# Patient Record
Sex: Male | Born: 1960 | Race: Black or African American | Hispanic: No | Marital: Single | Smoking: Current every day smoker
Health system: Southern US, Community
[De-identification: ages and names within clinical notes are randomized; demographics above are authoritative.]

## PROBLEM LIST (undated history)

## (undated) DIAGNOSIS — K259 Gastric ulcer, unspecified as acute or chronic, without hemorrhage or perforation: Secondary | ICD-10-CM

## (undated) DIAGNOSIS — J45909 Unspecified asthma, uncomplicated: Secondary | ICD-10-CM

## (undated) DIAGNOSIS — K859 Acute pancreatitis without necrosis or infection, unspecified: Secondary | ICD-10-CM

## (undated) DIAGNOSIS — J60 Coalworker's pneumoconiosis: Secondary | ICD-10-CM

## (undated) DIAGNOSIS — J449 Chronic obstructive pulmonary disease, unspecified: Secondary | ICD-10-CM

---

## 2004-08-25 ENCOUNTER — Emergency Department (HOSPITAL_COMMUNITY): Admission: EM | Admit: 2004-08-25 | Discharge: 2004-08-25 | Payer: Self-pay | Admitting: Emergency Medicine

## 2007-03-31 ENCOUNTER — Emergency Department (HOSPITAL_COMMUNITY): Admission: EM | Admit: 2007-03-31 | Discharge: 2007-03-31 | Payer: Self-pay | Admitting: Family Medicine

## 2008-07-24 ENCOUNTER — Emergency Department (HOSPITAL_COMMUNITY): Admission: EM | Admit: 2008-07-24 | Discharge: 2008-07-25 | Payer: Self-pay | Admitting: Emergency Medicine

## 2012-09-10 ENCOUNTER — Ambulatory Visit (HOSPITAL_COMMUNITY)
Admission: RE | Admit: 2012-09-10 | Discharge: 2012-09-10 | Disposition: A | Discharge status: Home / Self Care | Payer: Self-pay | Source: Ambulatory Visit | Attending: Internal Medicine | Admitting: Internal Medicine

## 2012-09-10 ENCOUNTER — Other Ambulatory Visit (HOSPITAL_COMMUNITY): Payer: Self-pay | Admitting: Internal Medicine

## 2012-09-10 DIAGNOSIS — R059 Cough, unspecified: Secondary | ICD-10-CM | POA: Insufficient documentation

## 2012-09-10 DIAGNOSIS — R062 Wheezing: Secondary | ICD-10-CM | POA: Insufficient documentation

## 2012-09-10 DIAGNOSIS — R52 Pain, unspecified: Secondary | ICD-10-CM

## 2012-09-10 DIAGNOSIS — R05 Cough: Secondary | ICD-10-CM | POA: Insufficient documentation

## 2012-09-10 DIAGNOSIS — R079 Chest pain, unspecified: Secondary | ICD-10-CM | POA: Insufficient documentation

## 2015-07-17 ENCOUNTER — Emergency Department (HOSPITAL_COMMUNITY)
Admission: EM | Admit: 2015-07-17 | Discharge: 2015-07-18 | Disposition: A | Discharge status: Home / Self Care | Payer: Medicaid Other | Attending: Emergency Medicine | Admitting: Emergency Medicine

## 2015-07-17 ENCOUNTER — Emergency Department (HOSPITAL_COMMUNITY): Payer: Medicaid Other

## 2015-07-17 ENCOUNTER — Encounter (HOSPITAL_COMMUNITY): Payer: Self-pay

## 2015-07-17 DIAGNOSIS — R062 Wheezing: Secondary | ICD-10-CM

## 2015-07-17 DIAGNOSIS — R1012 Left upper quadrant pain: Secondary | ICD-10-CM | POA: Insufficient documentation

## 2015-07-17 DIAGNOSIS — L299 Pruritus, unspecified: Secondary | ICD-10-CM | POA: Diagnosis not present

## 2015-07-17 DIAGNOSIS — R0602 Shortness of breath: Secondary | ICD-10-CM | POA: Diagnosis present

## 2015-07-17 DIAGNOSIS — R42 Dizziness and giddiness: Secondary | ICD-10-CM | POA: Diagnosis not present

## 2015-07-17 DIAGNOSIS — Z880 Allergy status to penicillin: Secondary | ICD-10-CM | POA: Insufficient documentation

## 2015-07-17 DIAGNOSIS — F17210 Nicotine dependence, cigarettes, uncomplicated: Secondary | ICD-10-CM | POA: Diagnosis not present

## 2015-07-17 DIAGNOSIS — R1013 Epigastric pain: Secondary | ICD-10-CM | POA: Insufficient documentation

## 2015-07-17 DIAGNOSIS — Z79899 Other long term (current) drug therapy: Secondary | ICD-10-CM | POA: Insufficient documentation

## 2015-07-17 DIAGNOSIS — J441 Chronic obstructive pulmonary disease with (acute) exacerbation: Secondary | ICD-10-CM | POA: Insufficient documentation

## 2015-07-17 HISTORY — DX: Unspecified asthma, uncomplicated: J45.909

## 2015-07-17 HISTORY — DX: Coalworker's pneumoconiosis: J60

## 2015-07-17 HISTORY — DX: Chronic obstructive pulmonary disease, unspecified: J44.9

## 2015-07-17 LAB — COMPREHENSIVE METABOLIC PANEL
ALBUMIN: 2.7 g/dL — AB (ref 3.5–5.0)
ALK PHOS: 83 U/L (ref 38–126)
ALT: 69 U/L — AB (ref 17–63)
AST: 114 U/L — AB (ref 15–41)
Anion gap: 11 (ref 5–15)
BUN: 8 mg/dL (ref 6–20)
CALCIUM: 9 mg/dL (ref 8.9–10.3)
CO2: 25 mmol/L (ref 22–32)
CREATININE: 0.89 mg/dL (ref 0.61–1.24)
Chloride: 100 mmol/L — ABNORMAL LOW (ref 101–111)
GFR calc Af Amer: >60 mL/min (ref >60)
GFR calc non Af Amer: >60 mL/min (ref >60)
GLUCOSE: 109 mg/dL — AB (ref 65–99)
Potassium: 4 mmol/L (ref 3.5–5.1)
SODIUM: 136 mmol/L (ref 135–145)
Total Bilirubin: 0.1 mg/dL — ABNORMAL LOW (ref 0.3–1.2)
Total Protein: 7.6 g/dL (ref 6.5–8.1)

## 2015-07-17 LAB — CBC WITH DIFFERENTIAL/PLATELET
BASOS ABS: 0 10*3/uL (ref 0.0–0.1)
BASOS PCT: 0 %
EOS ABS: 0.1 10*3/uL (ref 0.0–0.7)
Eosinophils Relative: 1 %
HCT: 36.1 % — ABNORMAL LOW (ref 39.0–52.0)
HEMOGLOBIN: 12.1 g/dL — AB (ref 13.0–17.0)
LYMPHS ABS: 1.2 10*3/uL (ref 0.7–4.0)
Lymphocytes Relative: 18 %
MCH: 30.1 pg (ref 26.0–34.0)
MCHC: 33.5 g/dL (ref 30.0–36.0)
MCV: 89.8 fL (ref 78.0–100.0)
Monocytes Absolute: 0.3 10*3/uL (ref 0.1–1.0)
Monocytes Relative: 4 %
NEUTROS PCT: 77 %
Neutro Abs: 5 10*3/uL (ref 1.7–7.7)
Platelets: 215 10*3/uL (ref 150–400)
RBC: 4.02 MIL/uL — AB (ref 4.22–5.81)
RDW: 16.4 % — ABNORMAL HIGH (ref 11.5–15.5)
WBC: 6.5 10*3/uL (ref 4.0–10.5)

## 2015-07-17 LAB — ETHANOL: Alcohol, Ethyl (B): 190 mg/dL — ABNORMAL HIGH (ref <5)

## 2015-07-17 LAB — LIPASE, BLOOD: Lipase: 63 U/L — ABNORMAL HIGH (ref 11–51)

## 2015-07-17 MED ORDER — IPRATROPIUM BROMIDE 0.02 % IN SOLN
1.0000 mg | Freq: Once | RESPIRATORY_TRACT | Status: AC
  Administered 2015-07-17: 1 mg via RESPIRATORY_TRACT
  Filled 2015-07-17: qty 5

## 2015-07-17 MED ORDER — METHYLPREDNISOLONE SODIUM SUCC 125 MG IJ SOLR
125.0000 mg | Freq: Once | INTRAMUSCULAR | Status: AC
  Administered 2015-07-17: 125 mg via INTRAVENOUS
  Filled 2015-07-17: qty 2

## 2015-07-17 MED ORDER — ALBUTEROL (5 MG/ML) CONTINUOUS INHALATION SOLN
15.0000 mg/h | INHALATION_SOLUTION | Freq: Once | RESPIRATORY_TRACT | Status: AC
  Administered 2015-07-17: 15 mg/h via RESPIRATORY_TRACT
  Filled 2015-07-17: qty 20

## 2015-07-17 MED ORDER — SODIUM CHLORIDE 0.9 % IV BOLUS (SEPSIS)
1000.0000 mL | Freq: Once | INTRAVENOUS | Status: AC
  Administered 2015-07-17: 1000 mL via INTRAVENOUS

## 2015-07-17 NOTE — ED Notes (Signed)
Pt from home by [facility] for generalized body pain and weakness. Pt had 2 breathing treatments with EMS. Pt states he is feeling much better and is talking in full sentences and sating at 93%

## 2015-07-17 NOTE — ED Provider Notes (Signed)
CSN: [id number]     Arrival date & time 07/17/15  2053 History   First MD Initiated Contact with Patient 07/17/15 2124     Chief Complaint  Patient presents with  . Shortness of Breath     (Consider location/radiation/quality/duration/timing/severity/associated sxs/prior Treatment) HPI   Patient is a 55 year old male with a history of COPD, asthma, Black lung, seasonal allergies, alcohol and tobacco abuse who presents the ED with shortness of breath and epigastric pain since 8:30 PM today. He states in the past week he's had episodes of epigastric pain every 4-5 hours which makes it difficult for him to breathe. He states his shortness of breath is secondary to the abdominal pain although he has had some wheezing. He states he takes a shot of alcohol and the stomach pain goes away. He also states food relieves his abdominal pain. Everyday drinker and tobacco smoker. Associated itchy watery eyes, runny nose, sinus congestion, nausea, right handedness. Patient denies vomiting, chest pain, numbness/tingling or weakness, headache, visual changes.  Past Medical History  Diagnosis Date  . COPD (chronic obstructive pulmonary disease) ([place])   . Black lung ([place])   . Asthma    No past surgical history on file. No family history on file. Social History  Substance Use Topics  . Smoking status: Current Every Day Smoker -- 0.25 packs/day    Types: Cigarettes  . Smokeless tobacco: Not on file  . Alcohol Use: Yes     Comment: 1 pt of liquor     Review of Systems  Constitutional: Negative for fever and chills.  HENT: Positive for congestion and rhinorrhea. Negative for ear pain, hearing loss and trouble swallowing.   Eyes: Positive for itching. Negative for pain and visual disturbance.  Respiratory: Positive for shortness of breath and wheezing. Negative for chest tightness.   Cardiovascular: Negative for chest pain and leg swelling.  Gastrointestinal: Positive for abdominal pain. Negative for  nausea, vomiting, diarrhea and blood in stool.  Genitourinary: Negative for dysuria and hematuria.  Musculoskeletal: Negative for joint swelling.  Skin: Negative for rash.  Neurological: Positive for light-headedness. Negative for syncope, weakness, numbness and headaches.  Psychiatric/Behavioral: Negative for confusion and agitation.      Allergies  Penicillins and Asa  Home Medications   Prior to Admission medications   Medication Sig Start Date End Date Taking? Authorizing Provider  COMBIVENT RESPIMAT 20-100 MCG/ACT AERS respimat Inhale 1 puff into the lungs every 6 (six) hours. 06/26/15  Yes Historical Provider, MD  [employer] D3 2000 units CAPS Take 2,000 Units by mouth daily. 07/06/15  Yes Historical Provider, MD  PROVENTIL HFA 108 (90 Base) MCG/ACT inhaler Inhale 2 puffs into the lungs every 6 (six) hours as needed. 06/26/15  Yes Historical Provider, MD  QVAR 80 MCG/ACT inhaler Inhale 2 puffs into the lungs 2 (two) times daily. 06/26/15  Yes Historical Provider, MD  ranitidine (ZANTAC) 150 MG tablet Take 150 mg by mouth 2 (two) times daily. 06/26/15  Yes Historical Provider, MD  cetirizine-pseudoephedrine (ZYRTEC-D) 5-120 MG tablet Take 1 tablet by mouth daily. 07/18/15   Kalman Drape, PA  omeprazole (PRILOSEC) 40 MG capsule Take 1 capsule (40 mg total) by mouth daily. Reported on 07/17/2015 07/18/15   Kalman Drape, PA  predniSONE (DELTASONE) 20 MG tablet Take 3 tablets (60 mg total) by mouth daily. 07/18/15   Keasia Dubose L Brittny Spangle, PA   BP 127/79 mmHg  Pulse 86  Temp(Src) 98.5 F (36.9 C) (Oral)  Resp 18  Ht 5'  7" (1.702 m)  Wt 58.968 kg  BMI 20.36 kg/m2  SpO2 92% Physical Exam  Constitutional: He appears well-developed and well-nourished.  Non-toxic appearance. He does not appear ill. No distress.  HENT:  Head: Normocephalic and atraumatic.  Eyes: Right conjunctiva is injected. Left conjunctiva is injected. No scleral icterus.  Neck: Normal range of motion.  Cardiovascular: Normal  rate, regular rhythm and normal heart sounds.  Exam reveals no gallop and no friction rub.   No murmur heard. Pulses:      Radial pulses are 2+ on the right side, and 2+ on the left side.       Dorsalis pedis pulses are 2+ on the right side, and 2+ on the left side.  Pulmonary/Chest: Effort normal. No accessory muscle usage. No respiratory distress.  Expiratory wheezes heard throughout all lung fields  Abdominal: Soft. Normal appearance and bowel sounds are normal. There is no rigidity and no guarding.  TTP of the epigastric and LUQ region  Musculoskeletal: Normal range of motion. He exhibits no edema.  Neurological: He is alert. Coordination normal.  Skin: Skin is warm and dry. No rash noted.  Psychiatric: He has a normal mood and affect. His behavior is normal.    ED Course  Procedures (including critical care time) Labs Review Labs Reviewed  CBC WITH DIFFERENTIAL/PLATELET - Abnormal; Notable for the following:    RBC 4.02 (*)    Hemoglobin 12.1 (*)    HCT 36.1 (*)    RDW 16.4 (*)    All other components within normal limits  COMPREHENSIVE METABOLIC PANEL - Abnormal; Notable for the following:    Chloride 100 (*)    Glucose, Bld 109 (*)    Albumin 2.7 (*)    AST 114 (*)    ALT 69 (*)    Total Bilirubin 0.1 (*)    All other components within normal limits  LIPASE, BLOOD - Abnormal; Notable for the following:    Lipase 63 (*)    All other components within normal limits  ETHANOL - Abnormal; Notable for the following:    Alcohol, Ethyl (B) 190 (*)    All other components within normal limits    Imaging Review Dg Chest 2 View  07/18/2015  CLINICAL DATA:  Cough, wheezing, shortness of breath for 1 week. EXAM: CHEST  2 VIEW COMPARISON:  Radiograph 09/10/2012 FINDINGS: Increased hyperinflation and bronchial thickening from prior. The cardiomediastinal contours are normal. The lungs are clear. Pulmonary vasculature is normal. No consolidation, pleural effusion, or pneumothorax.  Remote right rib fractures again seen. Remote injury to the right acromioclavicular joint. IMPRESSION: Increased bronchial thickening and hyperinflation, may reflect acute bronchitis, infectious or inflammatory. Electronically Signed   By: Jeb Levering M.D.   On: 07/18/2015 00:23   I have personally reviewed and evaluated these images and lab results as part of my medical decision-making.   EKG Interpretation   Date/Time:  Sunday Jul 17 2015 21:08:14 EDT Ventricular Rate:  70 PR Interval:  158 QRS Duration: 86 QT Interval:  369 QTC Calculation: 398 R Axis:   7 Text Interpretation:  Sinus or ectopic atrial rhythm Anterior infarct, old  Baseline wander in lead(s) V5 No significant change since last tracing  Confirmed by YAO  MD, DAVID ([id number]) on 07/17/2015 10:24:14 PM      MDM   Final diagnoses:  Wheezing  Shortness of breath  Epigastric pain    Patient's presentation and exam likely a mild COPD exacerbation. Patient with O2 saturations maintained >  90, no current signs of respiratory distress. Lung exam improved after nebulizer treatment. Solumedrol given in the ED and pt will bd dc with 5 day burst of prednisone. Pt states they are breathing at baseline. X-ray reviewed by me revealed bronchial thickening and hyperinflation likely 2/2 chronic COPD, asthma and smoking. Patient denies cough and is afebrile less likely pneumonia or acute infectious process. Pt has been instructed to continue using prescribed medications and to speak with PCP about today's exacerbation. Gave patient a prescription for Zyrtec.   Patient states his abdominal pain improved while in the ED. Gave patient a GI cocktail and a prescription for Prilosec. Instructed patient to follow up with his PCP regarding his abdominal pain which is likely a gastric ulcer from alcohol consumption. I instructed the patient to discontinue drinking alcohol. Informed patient his lipase and liver enzymes slightly elevated likely  secondary to alcohol abuse. Lab finding positive for ethanol. Patient denies blood in stool or melena so this is less likely a bleeding ulcer or upper GI bleed. Patient found to be mildly anemic likely 2/2 to alcoholism without signs or symptoms of anemia. Patient states his PCP is following him for his abdominal pain.  Patient states he will follow up with his PCP tomorrow regarding his visit to the ED today. I discussed strict return precautions. Patient expresses understanding to the discharge instructions.  Discussed with Dr. Darl Householder who agreed with the above plan.      Kalman Drape, [place] 07/18/15 0133  Wandra Arthurs, MD 07/20/15 2043

## 2015-07-18 MED ORDER — OMEPRAZOLE 40 MG PO CPDR: 40.0000 mg | DELAYED_RELEASE_CAPSULE | Freq: Every day | ORAL | Status: AC

## 2015-07-18 MED ORDER — GI COCKTAIL ~~LOC~~
30.0000 mL | Freq: Once | ORAL | Status: AC
  Administered 2015-07-18: 30 mL via ORAL
  Filled 2015-07-18: qty 30

## 2015-07-18 MED ORDER — ALBUTEROL SULFATE HFA 108 (90 BASE) MCG/ACT IN AERS
2.0000 | INHALATION_SPRAY | Freq: Once | RESPIRATORY_TRACT | Status: AC
  Administered 2015-07-18: 2 via RESPIRATORY_TRACT
  Filled 2015-07-18: qty 6.7

## 2015-07-18 MED ORDER — PREDNISONE 20 MG PO TABS: 60.0000 mg | ORAL_TABLET | Freq: Every day | ORAL | Status: AC

## 2015-07-18 MED ORDER — AEROCHAMBER PLUS W/MASK MISC
1.0000 | Freq: Once | Status: AC
  Administered 2015-07-18: 1
  Filled 2015-07-18: qty 1

## 2015-07-18 MED ORDER — CETIRIZINE-PSEUDOEPHEDRINE ER 5-120 MG PO TB12: 1.0000 | ORAL_TABLET | Freq: Every day | ORAL | Status: AC

## 2015-07-18 NOTE — Discharge Instructions (Signed)
Follow-up with your primary care provider Dr. Marlou Sa tomorrow regarding your visit to the emergency department.  Return to the emergency department if you experience worsening abdominal pain, blood in your vomit, shortness of breath, you pass out, fever, chills.

## 2015-12-16 ENCOUNTER — Encounter (HOSPITAL_COMMUNITY): Payer: Self-pay | Admitting: *Deleted

## 2015-12-16 ENCOUNTER — Emergency Department (HOSPITAL_COMMUNITY)
Admission: EM | Admit: 2015-12-16 | Discharge: 2015-12-16 | Disposition: A | Discharge status: Home / Self Care | Payer: Medicaid Other | Attending: Emergency Medicine | Admitting: Emergency Medicine

## 2015-12-16 ENCOUNTER — Emergency Department (HOSPITAL_COMMUNITY): Payer: Medicaid Other

## 2015-12-16 DIAGNOSIS — Y939 Activity, unspecified: Secondary | ICD-10-CM | POA: Insufficient documentation

## 2015-12-16 DIAGNOSIS — R079 Chest pain, unspecified: Secondary | ICD-10-CM

## 2015-12-16 DIAGNOSIS — Y999 Unspecified external cause status: Secondary | ICD-10-CM | POA: Insufficient documentation

## 2015-12-16 DIAGNOSIS — R071 Chest pain on breathing: Secondary | ICD-10-CM | POA: Diagnosis present

## 2015-12-16 DIAGNOSIS — W228XXA Striking against or struck by other objects, initial encounter: Secondary | ICD-10-CM | POA: Insufficient documentation

## 2015-12-16 DIAGNOSIS — R1010 Upper abdominal pain, unspecified: Secondary | ICD-10-CM | POA: Diagnosis not present

## 2015-12-16 DIAGNOSIS — F17210 Nicotine dependence, cigarettes, uncomplicated: Secondary | ICD-10-CM | POA: Diagnosis not present

## 2015-12-16 DIAGNOSIS — J449 Chronic obstructive pulmonary disease, unspecified: Secondary | ICD-10-CM | POA: Diagnosis not present

## 2015-12-16 DIAGNOSIS — R0789 Other chest pain: Secondary | ICD-10-CM

## 2015-12-16 DIAGNOSIS — R918 Other nonspecific abnormal finding of lung field: Secondary | ICD-10-CM | POA: Diagnosis not present

## 2015-12-16 DIAGNOSIS — Y929 Unspecified place or not applicable: Secondary | ICD-10-CM | POA: Diagnosis not present

## 2015-12-16 HISTORY — DX: Gastric ulcer, unspecified as acute or chronic, without hemorrhage or perforation: K25.9

## 2015-12-16 HISTORY — DX: Acute pancreatitis without necrosis or infection, unspecified: K85.90

## 2015-12-16 NOTE — ED Triage Notes (Signed)
Pt here via [facility].  States he had been drinking last night, stood up, felt dizzy and fell on a chair, landing on his chest.  States he was ok this am, but started feeling pain after riding in car with brother.  No c/o L chest pain on palpation and inspiration.

## 2015-12-16 NOTE — ED Provider Notes (Signed)
[place] DEPT Provider Note   CSN: [id number] Arrival date & time: 12/16/15  1511     History   Chief Complaint Chief Complaint  Patient presents with  . Chest Pain    HPI MINARD BORJAS is a 55 y.o. male.  HPI Patient states that he had a fall last night. He hit the left side of his chest and arm when he fell. Now he woke with more pain. Some difficulty breathing due to the pain. No change in his abdominal pain. No numbness or weakness. No hemoptysis. History of pancreatitis and COPD. Pain is sharp and worse with movement or breathing.  Past Medical History:  Diagnosis Date  . Asthma   . Black lung ([place])   . COPD (chronic obstructive pulmonary disease) ([place])   . Multiple gastric ulcers   . Pancreatitis     There are no active problems to display for this patient.   History reviewed. No pertinent surgical history.     Home Medications    Prior to Admission medications   Medication Sig Start Date End Date Taking? Authorizing Provider  COMBIVENT RESPIMAT 20-100 MCG/ACT AERS respimat Inhale 1 puff into the lungs every 6 (six) hours as needed for wheezing or shortness of breath.  06/26/15  Yes Historical Provider, MD  omeprazole (PRILOSEC) 40 MG capsule Take 1 capsule (40 mg total) by mouth daily. Reported on 07/17/2015 07/18/15  Yes Jessica L Focht, PA  OVER THE COUNTER MEDICATION Take 1 tablet by mouth daily. Vitamin B   Yes Historical Provider, MD  PROVENTIL HFA 108 (90 Base) MCG/ACT inhaler Inhale 2 puffs into the lungs every 6 (six) hours as needed for wheezing or shortness of breath.  06/26/15  Yes Historical Provider, MD  QVAR 80 MCG/ACT inhaler Inhale 2 puffs into the lungs 2 (two) times daily. 06/26/15  Yes Historical Provider, MD  ranitidine (ZANTAC) 150 MG tablet Take 150 mg by mouth 2 (two) times daily. 06/26/15  Yes Historical Provider, MD  cetirizine-pseudoephedrine (ZYRTEC-D) 5-120 MG tablet Take 1 tablet by mouth daily. Patient not taking: Reported on  12/16/2015 07/18/15   Kalman Drape, PA  predniSONE (DELTASONE) 20 MG tablet Take 3 tablets (60 mg total) by mouth daily. Patient not taking: Reported on 12/16/2015 07/18/15   Kalman Drape, PA    Family History No family history on file.  Social History Social History  Substance Use Topics  . Smoking status: Current Every Day Smoker    Packs/day: 0.25    Types: Cigarettes  . Smokeless tobacco: Never Used  . Alcohol use Yes     Comment: 1 pt of liquor      Allergies   Penicillins and Asa [aspirin]   Review of Systems Review of Systems  Constitutional: Negative for appetite change.  HENT: Negative for congestion.   Eyes: Negative for visual disturbance.  Respiratory: Negative for shortness of breath.   Cardiovascular: Positive for chest pain.  Gastrointestinal: Positive for abdominal pain (chronic, recent pancreatitis.).  Genitourinary: Negative for difficulty urinating.  Musculoskeletal: Negative for back pain and neck pain.  Neurological: Negative for headaches.  Hematological: Negative for adenopathy.  Psychiatric/Behavioral: Negative for confusion.     Physical Exam Updated Vital Signs BP 111/75   Pulse 98   Temp 99.9 F (37.7 C) (Oral)   Resp 17   Ht 5\' 7"  (1.702 m)   Wt 130 lb (59 kg)   SpO2 98%   BMI 20.36 kg/m   Physical Exam  Constitutional: He  appears well-developed.  HENT:  Head: Atraumatic.  Eyes: EOM are normal.  Neck: Neck supple.  Cardiovascular: Normal rate.   Pulmonary/Chest:  Tenderness to left lateral anterior chest wall. No crepitance. Equal breath sounds.   Abdominal: There is tenderness.  Mild upper abdominal tenderness.   Musculoskeletal: He exhibits no edema.  Neurological: He is alert.  Skin: Skin is warm.  Psychiatric: He has a normal mood and affect.     ED Treatments / Results  Labs (all labs ordered are listed, but only abnormal results are displayed) Labs Reviewed - No data to display  EKG  EKG  Interpretation None       Radiology Dg Ribs Unilateral W/chest Left  Result Date: 12/16/2015 CLINICAL DATA:  Collapse lung.  Chest pain.  Recent fall. EXAM: LEFT RIBS AND CHEST - 3+ VIEW COMPARISON:  07/17/2015. FINDINGS: Several ill-defined nodular densities in the right upper lobe. Questionable tiny nodules left upper and left lower lung. For further evaluation nonenhanced chest CT is suggested. No pleural effusion. Heart size normal. No pneumothorax. Old right rib fractures noted. No acute bony abnormality identified. IMPRESSION: 1. Questionable bilateral pulmonary nodules. Nonenhanced chest CT suggested for further evaluation. 2. No evidence of displaced rib fracture or pneumothorax. Old right rib fractures noted. Electronically Signed   By: Marcello Moores  Register   On: 12/16/2015 16:19   Ct Chest Wo Contrast  Result Date: 12/16/2015 CLINICAL DATA:  Right upper chest pain beginning today. Patient fell yesterday. Abnormal chest radiography earlier. EXAM: CT CHEST WITHOUT CONTRAST TECHNIQUE: Multidetector CT imaging of the chest was performed following the standard protocol without IV contrast. COMPARISON:  Chest radiography same day and 07/17/2015 FINDINGS: Cardiovascular: Normal heart size. No evidence of aortic or coronary artery atherosclerotic calcification. Mediastinum/Nodes: No visible hilar or mediastinal lymphadenopathy in the upper portion of the study. There is retrocrural lymphadenopathy with the largest node measuring up to 2.7 cm in diameter. Lungs/Pleura: Multiple pulmonary nodules bilaterally, fairly evenly distributed. Most of the nodules are 3 5 mm in size. There is a dominant right upper lobe nodule measuring 11 mm in diameter. The lungs show background pattern of early emphysema. There are a few areas where there is some hazy ground-glass opacity bilaterally. Given all these findings, I think atypical infection is the favored diagnosis, but the possibility of malignancy with metastatic  disease is certainly possible. Upper Abdomen: Retrocrural lymphadenopathy as mentioned above. No other upper abdominal finding. There could be hepatomegaly. Musculoskeletal: Old healed rib fractures on right. No acute finding. IMPRESSION: Background pattern of early emphysema. Newly seen widespread multiple bilateral pulmonary nodules, the largest measuring 11 mm in the right upper lobe. Few areas of hazy ground-glass opacity is well. Retrocrural lymphadenopathy with the largest node measuring 2.8 cm. Question hepatomegaly. The differential diagnosis in this case is that of malignant disease with metastases versus atypical infectious inflammation versus sarcoid. General medical evaluation may give an answer in this case, but if not, repeat imaging in 3 months would be suggested. Non-contrast chest CT at 3-6 months is recommended. If the nodules are stable at time of repeat CT, then future CT at 18-24 months (from today's scan) is considered optional for low-risk patients, but is recommended for high-risk patients. This recommendation follows the consensus statement: Guidelines for Management of Incidental Pulmonary Nodules Detected on CT Images: From the Fleischner Society 2017; Radiology 2017; 284:228-243. Electronically Signed   By: Nelson Chimes M.D.   On: 12/16/2015 18:14    Procedures Procedures (including critical care time)  Medications Ordered in ED Medications - No data to display   Initial Impression / Assessment and Plan / ED Course  I have reviewed the triage vital signs and the nursing notes.  Pertinent labs & imaging results that were available during my care of the patient were reviewed by me and considered in my medical decision making (see chart for details).  Clinical Course    Patient with chest pain on the left anterior chest wall after hitting it last night. Appears muscle skeletal. X-ray done and showed possible nodules. CT scan showed multiple nodules. Patient has a primary  care doctor, Dr. Marlou Sa who can follow [place]. He's not really had a cough or fever. Will discharge to follow-up as an outpatient per  Final Clinical Impressions(s) / ED Diagnoses   Final diagnoses:  Chest pain  Chest wall pain  Multiple lung nodules on CT    New Prescriptions New Prescriptions   No medications on file     Davonna Belling, MD 12/16/15 1911

## 2015-12-16 NOTE — Discharge Instructions (Signed)
Follow-up with Dr. Marlou Sa for more evaluation of your lung nodules.

## 2015-12-16 NOTE — ED Notes (Signed)
Pt transported to CT ?

## 2016-01-17 ENCOUNTER — Inpatient Hospital Stay (HOSPITAL_COMMUNITY)
Admission: EM | Admit: 2016-01-17 | Discharge: 2016-01-27 | DRG: 811 | Disposition: E | Payer: Medicaid Other | Attending: Student in an Organized Health Care Education/Training Program | Admitting: Student in an Organized Health Care Education/Training Program

## 2016-01-17 ENCOUNTER — Emergency Department (HOSPITAL_COMMUNITY): Payer: Medicaid Other

## 2016-01-17 ENCOUNTER — Encounter (HOSPITAL_COMMUNITY): Payer: Self-pay | Admitting: Emergency Medicine

## 2016-01-17 DIAGNOSIS — R0600 Dyspnea, unspecified: Secondary | ICD-10-CM | POA: Diagnosis not present

## 2016-01-17 DIAGNOSIS — Z66 Do not resuscitate: Secondary | ICD-10-CM | POA: Diagnosis not present

## 2016-01-17 DIAGNOSIS — E873 Alkalosis: Secondary | ICD-10-CM | POA: Diagnosis not present

## 2016-01-17 DIAGNOSIS — R0682 Tachypnea, not elsewhere classified: Secondary | ICD-10-CM | POA: Diagnosis not present

## 2016-01-17 DIAGNOSIS — C781 Secondary malignant neoplasm of mediastinum: Secondary | ICD-10-CM | POA: Diagnosis not present

## 2016-01-17 DIAGNOSIS — Z880 Allergy status to penicillin: Secondary | ICD-10-CM

## 2016-01-17 DIAGNOSIS — R509 Fever, unspecified: Secondary | ICD-10-CM | POA: Diagnosis not present

## 2016-01-17 DIAGNOSIS — R000 Tachycardia, unspecified: Secondary | ICD-10-CM | POA: Diagnosis present

## 2016-01-17 DIAGNOSIS — K7460 Unspecified cirrhosis of liver: Secondary | ICD-10-CM | POA: Diagnosis present

## 2016-01-17 DIAGNOSIS — D72828 Other elevated white blood cell count: Secondary | ICD-10-CM | POA: Diagnosis present

## 2016-01-17 DIAGNOSIS — R918 Other nonspecific abnormal finding of lung field: Secondary | ICD-10-CM

## 2016-01-17 DIAGNOSIS — R64 Cachexia: Secondary | ICD-10-CM | POA: Diagnosis present

## 2016-01-17 DIAGNOSIS — J9601 Acute respiratory failure with hypoxia: Secondary | ICD-10-CM | POA: Diagnosis present

## 2016-01-17 DIAGNOSIS — D649 Anemia, unspecified: Principal | ICD-10-CM | POA: Diagnosis present

## 2016-01-17 DIAGNOSIS — F1020 Alcohol dependence, uncomplicated: Secondary | ICD-10-CM | POA: Diagnosis not present

## 2016-01-17 DIAGNOSIS — M4800 Spinal stenosis, site unspecified: Secondary | ICD-10-CM | POA: Diagnosis present

## 2016-01-17 DIAGNOSIS — R188 Other ascites: Secondary | ICD-10-CM | POA: Diagnosis present

## 2016-01-17 DIAGNOSIS — D72829 Elevated white blood cell count, unspecified: Secondary | ICD-10-CM | POA: Diagnosis not present

## 2016-01-17 DIAGNOSIS — Z79899 Other long term (current) drug therapy: Secondary | ICD-10-CM

## 2016-01-17 DIAGNOSIS — E119 Type 2 diabetes mellitus without complications: Secondary | ICD-10-CM | POA: Diagnosis present

## 2016-01-17 DIAGNOSIS — Z515 Encounter for palliative care: Secondary | ICD-10-CM | POA: Diagnosis present

## 2016-01-17 DIAGNOSIS — J439 Emphysema, unspecified: Secondary | ICD-10-CM | POA: Diagnosis present

## 2016-01-17 DIAGNOSIS — F1290 Cannabis use, unspecified, uncomplicated: Secondary | ICD-10-CM | POA: Diagnosis present

## 2016-01-17 DIAGNOSIS — I444 Left anterior fascicular block: Secondary | ICD-10-CM | POA: Diagnosis present

## 2016-01-17 DIAGNOSIS — R1901 Right upper quadrant abdominal swelling, mass and lump: Secondary | ICD-10-CM

## 2016-01-17 DIAGNOSIS — Z8711 Personal history of peptic ulcer disease: Secondary | ICD-10-CM

## 2016-01-17 DIAGNOSIS — R160 Hepatomegaly, not elsewhere classified: Secondary | ICD-10-CM | POA: Diagnosis not present

## 2016-01-17 DIAGNOSIS — D473 Essential (hemorrhagic) thrombocythemia: Secondary | ICD-10-CM | POA: Diagnosis present

## 2016-01-17 DIAGNOSIS — R748 Abnormal levels of other serum enzymes: Secondary | ICD-10-CM | POA: Diagnosis present

## 2016-01-17 DIAGNOSIS — R61 Generalized hyperhidrosis: Secondary | ICD-10-CM | POA: Diagnosis present

## 2016-01-17 DIAGNOSIS — C7951 Secondary malignant neoplasm of bone: Secondary | ICD-10-CM | POA: Diagnosis present

## 2016-01-17 DIAGNOSIS — F1010 Alcohol abuse, uncomplicated: Secondary | ICD-10-CM | POA: Diagnosis present

## 2016-01-17 DIAGNOSIS — R1011 Right upper quadrant pain: Secondary | ICD-10-CM

## 2016-01-17 DIAGNOSIS — C259 Malignant neoplasm of pancreas, unspecified: Secondary | ICD-10-CM | POA: Diagnosis present

## 2016-01-17 DIAGNOSIS — C787 Secondary malignant neoplasm of liver and intrahepatic bile duct: Secondary | ICD-10-CM | POA: Diagnosis present

## 2016-01-17 DIAGNOSIS — R0602 Shortness of breath: Secondary | ICD-10-CM | POA: Diagnosis present

## 2016-01-17 DIAGNOSIS — C7800 Secondary malignant neoplasm of unspecified lung: Secondary | ICD-10-CM | POA: Diagnosis present

## 2016-01-17 DIAGNOSIS — C801 Malignant (primary) neoplasm, unspecified: Secondary | ICD-10-CM | POA: Diagnosis not present

## 2016-01-17 DIAGNOSIS — F17210 Nicotine dependence, cigarettes, uncomplicated: Secondary | ICD-10-CM | POA: Diagnosis present

## 2016-01-17 DIAGNOSIS — D508 Other iron deficiency anemias: Secondary | ICD-10-CM | POA: Diagnosis not present

## 2016-01-17 DIAGNOSIS — Z886 Allergy status to analgesic agent status: Secondary | ICD-10-CM

## 2016-01-17 LAB — LACTATE DEHYDROGENASE: LDH: 515 U/L — ABNORMAL HIGH (ref 98–192)

## 2016-01-17 LAB — ABO/RH: ABO/RH(D): B POS

## 2016-01-17 LAB — URINALYSIS, ROUTINE W REFLEX MICROSCOPIC
Glucose, UA: NEGATIVE mg/dL
Hgb urine dipstick: NEGATIVE
Ketones, ur: NEGATIVE mg/dL
Leukocytes, UA: NEGATIVE
Nitrite: POSITIVE — AB
Protein, ur: 30 mg/dL — AB
Specific Gravity, Urine: >1.046 — ABNORMAL HIGH (ref 1.005–1.030)
pH: 6 (ref 5.0–8.0)

## 2016-01-17 LAB — CBC WITH DIFFERENTIAL/PLATELET
Basophils Absolute: 0 10*3/uL (ref 0.0–0.1)
Basophils Relative: 0 %
Eosinophils Absolute: 0 10*3/uL (ref 0.0–0.7)
Eosinophils Relative: 0 %
HCT: 11.9 % — ABNORMAL LOW (ref 39.0–52.0)
Hemoglobin: 4.1 g/dL — CL (ref 13.0–17.0)
Lymphocytes Relative: 6 %
Lymphs Abs: 3.3 10*3/uL (ref 0.7–4.0)
MCH: 27.3 pg (ref 26.0–34.0)
MCHC: 34.5 g/dL (ref 30.0–36.0)
MCV: 79.3 fL (ref 78.0–100.0)
Monocytes Absolute: 3.3 10*3/uL — ABNORMAL HIGH (ref 0.1–1.0)
Monocytes Relative: 6 %
Neutro Abs: 48.3 10*3/uL — ABNORMAL HIGH (ref 1.7–7.7)
Neutrophils Relative %: 88 %
Platelets: 686 10*3/uL — ABNORMAL HIGH (ref 150–400)
RBC: 1.5 MIL/uL — ABNORMAL LOW (ref 4.22–5.81)
RDW: 16.9 % — ABNORMAL HIGH (ref 11.5–15.5)
WBC: 54.9 10*3/uL (ref 4.0–10.5)

## 2016-01-17 LAB — URINE MICROSCOPIC-ADD ON

## 2016-01-17 LAB — PREPARE RBC (CROSSMATCH)

## 2016-01-17 LAB — I-STAT CG4 LACTIC ACID, ED: Lactic Acid, Venous: 5.65 mmol/L (ref 0.5–1.9)

## 2016-01-17 LAB — COMPREHENSIVE METABOLIC PANEL
ALT: 23 U/L (ref 17–63)
AST: 80 U/L — ABNORMAL HIGH (ref 15–41)
Albumin: 1 g/dL — ABNORMAL LOW (ref 3.5–5.0)
Alkaline Phosphatase: 212 U/L — ABNORMAL HIGH (ref 38–126)
Anion gap: 14 (ref 5–15)
BUN: 16 mg/dL (ref 6–20)
CO2: 23 mmol/L (ref 22–32)
Calcium: 8.4 mg/dL — ABNORMAL LOW (ref 8.9–10.3)
Chloride: 90 mmol/L — ABNORMAL LOW (ref 101–111)
Creatinine, Ser: 0.74 mg/dL (ref 0.61–1.24)
GFR calc Af Amer: >60 mL/min (ref >60)
GFR calc non Af Amer: >60 mL/min (ref >60)
Glucose, Bld: 105 mg/dL — ABNORMAL HIGH (ref 65–99)
Potassium: 4.5 mmol/L (ref 3.5–5.1)
Sodium: 127 mmol/L — ABNORMAL LOW (ref 135–145)
Total Bilirubin: 4 mg/dL — ABNORMAL HIGH (ref 0.3–1.2)
Total Protein: 7 g/dL (ref 6.5–8.1)

## 2016-01-17 LAB — POC OCCULT BLOOD, ED: Fecal Occult Bld: NEGATIVE

## 2016-01-17 LAB — SAVE SMEAR

## 2016-01-17 LAB — DIRECT ANTIGLOBULIN TEST (NOT AT ARMC)
DAT, IgG: NEGATIVE
DAT, complement: NEGATIVE

## 2016-01-17 LAB — LIPASE, BLOOD: Lipase: 27 U/L (ref 11–51)

## 2016-01-17 MED ORDER — ENOXAPARIN SODIUM 40 MG/0.4ML ~~LOC~~ SOLN
40.0000 mg | SUBCUTANEOUS | Status: DC
  Administered 2016-01-17 – 2016-01-22 (×6): 40 mg via SUBCUTANEOUS
  Filled 2016-01-17 (×6): qty 0.4

## 2016-01-17 MED ORDER — LEVOFLOXACIN IN D5W 750 MG/150ML IV SOLN
750.0000 mg | INTRAVENOUS | Status: DC
  Administered 2016-01-18 – 2016-01-19 (×2): 750 mg via INTRAVENOUS
  Filled 2016-01-17 (×3): qty 150

## 2016-01-17 MED ORDER — SODIUM CHLORIDE 0.9 % IV SOLN
10.0000 mL/h | Freq: Once | INTRAVENOUS | Status: AC
  Administered 2016-01-17: 10 mL/h via INTRAVENOUS

## 2016-01-17 MED ORDER — ALBUTEROL SULFATE (2.5 MG/3ML) 0.083% IN NEBU
2.5000 mg | INHALATION_SOLUTION | Freq: Four times a day (QID) | RESPIRATORY_TRACT | Status: DC | PRN
  Administered 2016-01-18 – 2016-01-19 (×4): 2.5 mg via RESPIRATORY_TRACT
  Filled 2016-01-17 (×4): qty 3

## 2016-01-17 MED ORDER — SODIUM CHLORIDE 0.9 % IV BOLUS (SEPSIS)
500.0000 mL | Freq: Once | INTRAVENOUS | Status: AC
  Administered 2016-01-17: 500 mL via INTRAVENOUS

## 2016-01-17 MED ORDER — DEXTROSE 5 % IV SOLN
2.0000 g | Freq: Once | INTRAVENOUS | Status: AC
  Administered 2016-01-17: 2 g via INTRAVENOUS
  Filled 2016-01-17: qty 2

## 2016-01-17 MED ORDER — DEXTROSE 5 % IV SOLN
1.0000 g | Freq: Three times a day (TID) | INTRAVENOUS | Status: DC
  Filled 2016-01-17 (×2): qty 1

## 2016-01-17 MED ORDER — SODIUM CHLORIDE 0.9 % IV BOLUS (SEPSIS)
1000.0000 mL | Freq: Once | INTRAVENOUS | Status: AC
  Administered 2016-01-17: 1000 mL via INTRAVENOUS

## 2016-01-17 MED ORDER — VANCOMYCIN HCL IN DEXTROSE 750-5 MG/150ML-% IV SOLN
750.0000 mg | Freq: Two times a day (BID) | INTRAVENOUS | Status: DC
  Filled 2016-01-17: qty 150

## 2016-01-17 MED ORDER — SODIUM CHLORIDE 0.9 % IV BOLUS (SEPSIS)
250.0000 mL | Freq: Once | INTRAVENOUS | Status: AC
Start: 2016-01-17 — End: 2016-01-17
  Administered 2016-01-17: 250 mL via INTRAVENOUS

## 2016-01-17 MED ORDER — IOPAMIDOL (ISOVUE-370) INJECTION 76%
INTRAVENOUS | Status: AC
  Administered 2016-01-17: 80 mL
  Filled 2016-01-17: qty 100

## 2016-01-17 MED ORDER — LEVOFLOXACIN IN D5W 750 MG/150ML IV SOLN
750.0000 mg | Freq: Once | INTRAVENOUS | Status: AC
  Administered 2016-01-17: 750 mg via INTRAVENOUS
  Filled 2016-01-17: qty 150

## 2016-01-17 MED ORDER — VANCOMYCIN HCL IN DEXTROSE 1-5 GM/200ML-% IV SOLN
1000.0000 mg | Freq: Once | INTRAVENOUS | Status: AC
  Administered 2016-01-17: 1000 mg via INTRAVENOUS
  Filled 2016-01-17: qty 200

## 2016-01-17 MED ORDER — SODIUM CHLORIDE 0.9% FLUSH
3.0000 mL | Freq: Two times a day (BID) | INTRAVENOUS | Status: DC
  Administered 2016-01-17 – 2016-01-23 (×9): 3 mL via INTRAVENOUS

## 2016-01-17 NOTE — ED Notes (Signed)
Critical value called. WBC 54.9; Hemoglobin 4.1. Merry Proud, [place] made aware.

## 2016-01-17 NOTE — H&P (Signed)
Date: 01/07/2016               Patient Name:  Scott Mahoney MRN: [id number]  DOB: 07-30-1960 Age / Sex: 55 y.o., male   PCP: Scott Blocker, MD         Medical Service: Internal Medicine Teaching Service         Attending Physician: Dr. Fatima Blank, MD    First Contact: Dr. Inda Mahoney Pager: [phone]  Second Contact: Dr. Benjamine Mahoney Pager: [phone]       After Hours (After 5p/  First Contact Pager: [phone]  weekends / holidays): Second Contact Pager: [phone]   Chief Complaint: shortness of breath  History of Present Illness: Scott Mahoney is a 55 year old man with history of COPD, DM2, pancreatitis, PUD, and alcohol abuse who presents with dyspnea.  He has had night sweats for the past month or two. He has lost 60 lbs in the past year and his belly has been tender for the past 6 months.  Denies IV drug use.  He has an extensive history of heavy alcohol use, but has now been abstinent for 1 month.   He has also been cough frothy white sputum for the past couple of days.  Denies melena, hematochezia, hematuria, hemoptysis, bleeding or bruising.  He has never previously required blood transfusions, and does not have history of sickle cell disease or thalassemia.  In the ED, CTA chest did not show PE but did reveal many small pulmonary nodules, enlarged since prior one month ago, and new ascites.  Subsequent ultrasound of the abdomen revealed multiple liver masses consistent with metastatic disease.  Meds:  Current Meds  Medication Sig  . omeprazole (PRILOSEC) 40 MG capsule Take 1 capsule (40 mg total) by mouth daily. Reported on 07/17/2015  . ranitidine (ZANTAC) 150 MG tablet Take 150 mg by mouth at bedtime.      Allergies: Allergies as of 01/11/2016 - Review Complete 01/24/2016  Allergen Reaction Noted  . Penicillins Anaphylaxis 07/17/2015  . Asa [aspirin] Other (See Comments) 07/17/2015   Past Medical History:  Diagnosis Date  . Asthma   . Black lung ([place])   . COPD  (chronic obstructive pulmonary disease) ([place])   . Multiple gastric ulcers   . Pancreatitis     Family History: No malignancy  Social History:  Heavy alcohol use (up to a pint of liquor a day for 20 years), quit 1 month ago Daily marijuana use, no IVDU Current light cigar smoking Lives with girlfriend and stepdaugher  Review of Systems: A complete ROS was negative except as per HPI.  Physical Exam: Blood pressure 100/69, pulse 112, temperature 99 F (37.2 C), resp. rate (!) 31, height _0  (1.702 m), weight 120 lb (54.4 kg), SpO2 97 %.  Physical Exam  Constitutional: He is oriented to person, place, and time.  Thin man in no distress  HENT:  Head: Normocephalic and atraumatic.  Sublingual pallor  Eyes:  Scera icteric Conjunctival pallor  Neck: Normal range of motion. Neck supple.  Cardiovascular: Normal rate and regular rhythm.   Pulmonary/Chest: Effort normal and breath sounds normal.  Abdominal:  Abdomen tense, tender to palpation most over RUQ Hepatomegaly, flank dullness  Musculoskeletal: He exhibits no edema or tenderness.  Lymphadenopathy:    He has no cervical adenopathy.  Neurological: He is alert and oriented to person, place, and time.  Skin: Skin is warm and dry. There is pallor.  Psychiatric: He has a normal  mood and affect. His behavior is normal.    CBC Latest Ref Rng & Units 01/21/2016 07/17/2015  WBC 4.0 - 10.5 K/uL 54.9(HH) 6.5  Hemoglobin 13.0 - 17.0 g/dL 4.1(LL) 12.1(L)  Hematocrit 39.0 - 52.0 % 11.9(L) 36.1(L)  Platelets 150 - 400 K/uL 686(H) 215   CMP Latest Ref Rng & Units 01/16/2016 07/17/2015  Glucose 65 - 99 mg/dL 105(H) 109(H)  BUN 6 - 20 mg/dL 16 8  Creatinine 0.61 - 1.24 mg/dL 0.74 0.89  Sodium 135 - 145 mmol/L 127(L) 136  Potassium 3.5 - 5.1 mmol/L 4.5 4.0  Chloride 101 - 111 mmol/L 90(L) 100(L)  CO2 22 - 32 mmol/L 23 25  Calcium 8.9 - 10.3 mg/dL 8.4(L) 9.0  Total Protein 6.5 - 8.1 g/dL 7.0 7.6  Total Bilirubin 0.3 - 1.2 mg/dL  4.0(H) 0.1(L)  Alkaline Phos 38 - 126 U/L 212(H) 83  AST 15 - 41 U/L 80(H) 114(H)  ALT 17 - 63 U/L 23 69(H)   Lipase     Component Value Date/Time   LIPASE 27 01/01/2016 1119   Component     Latest Ref Rng & Units 01/02/2016  Lactic Acid, Venous     0.5 - 1.9 mmol/L 5.65 (HH)   Component     Latest Ref Rng & Units 01/12/2016  LDH     98 - 192 U/L 515 (H)   Component     Latest Ref Rng & Units 12/30/2015  Fecal Occult Blood, POC     NEGATIVE NEGATIVE   CTA Chest PE 01/10/2016 IMPRESSION: 1. No evidence of pulmonary emboli. 2. Emphysema with increased bronchial wall thickening and new left lower lobe centrilobular nodules which may reflect endobronchial infection. 3. Persistent nodules throughout both lungs, some of which demonstrate a mild increase in size and with at least 1 new nodule in the right middle lobe. Given only a short interval since the prior study, differential considerations remain metastatic disease, atypical infection, and an inflammatory process such as sarcoidosis. Follow-up chest CT is recommended in 2-3 months. 4. Persistent retrocrural lymphadenopathy. 5. New small volume ascites in the upper abdomen. CT of the abdomen and pelvis may be helpful given this new finding and to evaluate for evidence of metastatic disease or other systemic inflammation outside of the chest.  US Abdomen 01/24/2016 IMPRESSION: Findings consistent with multiple hepatic metastases. CT or MRI scan with contrast administration is recommended for further evaluation.  Severe gallbladder wall thickening is noted without sonographic Murphy's sign. There is noted surrounding fluid which may represent ascites. This may be due to the ascites and adjacent hepatocellular disease, but cholecystitis cannot be excluded. HIDA scan may be performed for further evaluation.  Assessment & Plan by Problem: Principal Problem:   Symptomatic anemia Active Problems:   Liver masses    Lung nodules  #Symptomatic Anemia Profoundly anemic, normocytic, with elevated LDH and TBili suggestive of hemolysis.  No clinical signs of bleeding including GI bleeding.  In context of possible metastatic disease, could be paraneoplastic process. Alternatively, he would have intra-abdominal bleed into a tumor. -Transfuse 2U pRBCs -Anticipate transfusing further pRBCs to Hgb >7.0 -DAT -Direct Bili -Haptoglobin -Retics -CT abdomen tomorrow, or STAT if inappropriate post-transfusion increase in Hgb  #Leukocytosis Malignancy vs infection.  No clear localizing signs of infection.  His dyspnea is most likely explained by his anemia, but his multiple lung nodules could reflect an atypical infection.  No diarrhea to suggest C Diff.  No skin breakdown.  No MRSA or pseudomonas risk  factors.  His tender abdomen, elevated alk phos, and severe gallbladder wall thickening could represent cholecystitis, possible secondary to biliary obstruction. -Empiric levofloxacin -Blood cultures -CT abdomen  #Lung Nodules #Liver Masses -CT Abdomen Pelvis w/ contrast tomorrow (after CTA chest dye load today)  Dispo: Admit patient to Observation with expected length of stay less than 2 midnights.  Signed: Minus Liberty, MD 01/01/2016, 6:39 PM  Pager: [phone]

## 2016-01-17 NOTE — ED Notes (Signed)
Patient transported to CT 

## 2016-01-17 NOTE — ED Notes (Signed)
Pt placed on 0.5 L [place]; sats 94%.

## 2016-01-17 NOTE — ED Provider Notes (Signed)
[facility] DEPT Provider Note   CSN: [id number] Arrival date & time: 01/01/2016  1047     History   Chief Complaint Chief Complaint  Patient presents with  . Shortness of Breath    HPI Scott Mahoney is a 55 y.o. male.  HPI   55 year old male with history of asthma, COPD, pancreatitis presents today with complaints of shortness of breath. Patient reports symptoms started last night with acute onset of shortness of breath. Patient notes wheezing at the same time. She was brought via EMS, he received 10 mg of albuterol, 0.5 mg Atrovent. He denies any associated chest pain.   Patient also notes abdominal discomfort, he notes that this has been going on for the last month, noting that his abdomen as becoming larger and larger, tenderness to palpation greater at the right upper quadrant. Patient notes chronic alcohol use. She denies any fever at home. He reports he recently had an episode of hemorrhoids and had a small amount of bright red blood per rectum, notes this is very minor and not persistent. He denies any abnormal bruising or bleeding, denies any other signs of bleeding including dark or tarry stools.  Patient also notes a significant weight loss over the last several months. He describes a decreased appetite.   Patient is a lifelong alcoholic, recently started drinking several weeks ago per patient  Past Medical History:  Diagnosis Date  . Asthma   . Black lung ([place])   . COPD (chronic obstructive pulmonary disease) ([place])   . Multiple gastric ulcers   . Pancreatitis     There are no active problems to display for this patient.   History reviewed. No pertinent surgical history.    Home Medications    Prior to Admission medications   Medication Sig Start Date End Date Taking? Authorizing Provider  cetirizine-pseudoephedrine (ZYRTEC-D) 5-120 MG tablet Take 1 tablet by mouth daily. Patient not taking: Reported on 12/16/2015 07/18/15   Jessica L Focht, PA    COMBIVENT RESPIMAT 20-100 MCG/ACT AERS respimat Inhale 1 puff into the lungs every 6 (six) hours as needed for wheezing or shortness of breath.  06/26/15   Historical Provider, MD  omeprazole (PRILOSEC) 40 MG capsule Take 1 capsule (40 mg total) by mouth daily. Reported on 07/17/2015 07/18/15   Fraser Din Focht, PA  OVER THE COUNTER MEDICATION Take 1 tablet by mouth daily. Vitamin B    Historical Provider, MD  predniSONE (DELTASONE) 20 MG tablet Take 3 tablets (60 mg total) by mouth daily. Patient not taking: Reported on 12/16/2015 07/18/15   Fraser Din Focht, PA  PROVENTIL HFA 108 (90 Base) MCG/ACT inhaler Inhale 2 puffs into the lungs every 6 (six) hours as needed for wheezing or shortness of breath.  06/26/15   Historical Provider, MD  QVAR 80 MCG/ACT inhaler Inhale 2 puffs into the lungs 2 (two) times daily. 06/26/15   Historical Provider, MD  ranitidine (ZANTAC) 150 MG tablet Take 150 mg by mouth 2 (two) times daily. 06/26/15   Historical Provider, MD    Family History No family history on file.  Social History Social History  Substance Use Topics  . Smoking status: Current Every Day Smoker    Packs/day: 0.25    Types: Cigarettes  . Smokeless tobacco: Never Used  . Alcohol use Yes     Comment: 1 pt of liquor      Allergies   Penicillins and Asa [aspirin]   Review of Systems Review of Systems  All other  systems reviewed and are negative.   Physical Exam Updated Vital Signs BP 90/61   Pulse 116   Temp 99.1 F (37.3 C)   Resp (!) 30   Ht 5\' 7"  (1.702 m)   Wt 54.4 kg   SpO2 100%   BMI 18.79 kg/m   Physical Exam  Constitutional: He is oriented to person, place, and time. He appears well-developed and well-nourished.  HENT:  Head: Normocephalic and atraumatic.  Eyes: Conjunctivae are normal. Pupils are equal, round, and reactive to light. Right eye exhibits no discharge. Left eye exhibits no discharge. No scleral icterus.  Neck: Normal range of motion. No JVD present. No  tracheal deviation present.  Pulmonary/Chest: Effort normal and breath sounds normal. No stridor. No respiratory distress. He has no wheezes. He has no rales. He exhibits no tenderness.  Abdominal: He exhibits distension. There is tenderness.  Palpable liver TTP RUQ, mild distention   Neurological: He is alert and oriented to person, place, and time. Coordination normal.  Psychiatric: He has a normal mood and affect. His behavior is normal. Judgment and thought content normal.  Nursing note and vitals reviewed.   ED Treatments / Results  Labs (all labs ordered are listed, but only abnormal results are displayed) Labs Reviewed  CBC WITH DIFFERENTIAL/PLATELET - Abnormal; Notable for the following:       Result Value   WBC 54.9 (*)    RBC 1.50 (*)    Hemoglobin 4.1 (*)    HCT 11.9 (*)    RDW 16.9 (*)    Platelets 686 (*)    Neutro Abs 48.3 (*)    Monocytes Absolute 3.3 (*)    All other components within normal limits  COMPREHENSIVE METABOLIC PANEL - Abnormal; Notable for the following:    Sodium 127 (*)    Chloride 90 (*)    Glucose, Bld 105 (*)    Calcium 8.4 (*)    Albumin 1.0 (*)    AST 80 (*)    Alkaline Phosphatase 212 (*)    Total Bilirubin 4.0 (*)    All other components within normal limits  URINALYSIS, ROUTINE W REFLEX MICROSCOPIC (NOT AT [facility]) - Abnormal; Notable for the following:    Color, Urine ORANGE (*)    Specific Gravity, Urine >1.046 (*)    Bilirubin Urine LARGE (*)    Protein, ur 30 (*)    Nitrite POSITIVE (*)    All other components within normal limits  LACTATE DEHYDROGENASE - Abnormal; Notable for the following:    LDH 515 (*)    All other components within normal limits  URINE MICROSCOPIC-ADD ON - Abnormal; Notable for the following:    Squamous Epithelial / LPF 0-5 (*)    Bacteria, UA RARE (*)    Casts GRANULAR CAST (*)    All other components within normal limits  I-STAT CG4 LACTIC ACID, ED - Abnormal; Notable for the following:    Lactic Acid,  Venous 5.65 (*)    All other components within normal limits  CULTURE, BLOOD (ROUTINE X 2)  CULTURE, BLOOD (ROUTINE X 2)  URINE CULTURE  LIPASE, BLOOD  SAVE SMEAR  POC OCCULT BLOOD, ED  I-STAT CG4 LACTIC ACID, ED  I-STAT CG4 LACTIC ACID, ED  PREPARE RBC (CROSSMATCH)  TYPE AND SCREEN  ABO/RH    EKG  EKG Interpretation  Date/Time:  Tuesday January 17 2016 12:52:14 EST Ventricular Rate:  133 PR Interval:    QRS Duration: 74 QT Interval:  278 QTC Calculation:  414 R Axis:   -72 Text Interpretation:  Sinus tachycardia Left anterior fascicular block Probable anteroseptal infarct, old No significant change since last tracing Confirmed by [facility] MD, PEDRO [phone]) on 01/24/2016 1:12:13 PM Also confirmed by [facility] MD, [place] [phone]), editor [place] CT, Leda Gauze [phone])  on 01/25/2016 2:01:27 PM       Radiology Ct Angio Chest Pe W And/or Wo Contrast  Result Date: 01/12/2016 CLINICAL DATA:  Shortness of breath and chest pain beginning last night. History of COPD. EXAM: CT ANGIOGRAPHY CHEST WITH CONTRAST TECHNIQUE: Multidetector CT imaging of the chest was performed using the standard protocol during bolus administration of intravenous contrast. Multiplanar CT image reconstructions and MIPs were obtained to evaluate the vascular anatomy. CONTRAST:  80 mL Isovue 370 COMPARISON:  Chest CT 12/16/2015 FINDINGS: Cardiovascular: Pulmonary arterial opacification is adequate without evidence of emboli. Subsegmental assessment is limited in some regions by motion artifact. The thoracic aorta is normal in caliber. The heart is normal in size. There is no pericardial effusion. Mediastinum/Nodes: No enlarged axillary or hilar lymph nodes are identified. Retrocrural lymphadenopathy is similar to the prior study, measuring 2.8 x 2.1 cm. The thyroid, trachea, and esophagus are unremarkable. Lungs/Pleura: No pleural or pericardial effusion. Emphysema. Numerous small nodules are again seen throughout all lobes  bilaterally. Many nodules have not significantly changed, however there are multiple nodules which have mildly increased in size with example a nodules annotated on series 5). The largest nodule measures 12 x 11 mm in the right upper lobe (series 5, image 51, previously 11 x 10 mm). Examples of enlarging nodules include a 6 mm right upper lobe nodule just above the minor fissure (series 5, image 69, previously 3 mm), a 4 mm nodule in the lingula (series 5, image 97, previously 3 mm), a 6 mm nodule in the lingula (series 5, image 105, previously 4 mm), and a 6 mm nodule in the left lower lobe (series 5, image 111, previously 4 mm). There is a new 4 mm right middle lobe nodule (series 5, image 91). There is bronchial wall thickening, most notable in the left lower lobe. There are also multiple new centrilobular nodules in the left lower lobe with areas of tree-in-bud nodularity. Upper Abdomen: New small volume ascites partially visualized in the upper abdomen. The visualized portion of the liver again appears prominent in size. Musculoskeletal: Old healed right-sided rib fractures. No suspicious lytic or blastic osseous lesion. Review of the MIP images confirms the above findings. IMPRESSION: 1. No evidence of pulmonary emboli. 2. Emphysema with increased bronchial wall thickening and new left lower lobe centrilobular nodules which may reflect endobronchial infection. 3. Persistent nodules throughout both lungs, some of which demonstrate a mild increase in size and with at least 1 new nodule in the right middle lobe. Given only a short interval since the prior study, differential considerations remain metastatic disease, atypical infection, and an inflammatory process such as sarcoidosis. Follow-up chest CT is recommended in 2-3 months. 4. Persistent retrocrural lymphadenopathy. 5. New small volume ascites in the upper abdomen. CT of the abdomen and pelvis may be helpful given this new finding and to evaluate for  evidence of metastatic disease or other systemic inflammation outside of the chest. Electronically Signed   By: Logan Bores M.D.   On: 01/22/2016 12:53   US Abdomen Limited Ruq  Result Date: 01/07/2016 CLINICAL DATA:  Acute right upper quadrant abdominal pain. EXAM: US ABDOMEN LIMITED - RIGHT UPPER QUADRANT COMPARISON:  CT scan of same  day. FINDINGS: Gallbladder: No gallstones are noted. Severe gallbladder wall thickening is noted without sonographic Murphy's sign. Mild amount of ascites is noted. Common bile duct: Diameter: 4.8 mm which is within normal limits. Liver: Multiple rounded masses are noted throughout the liver concerning for metastatic disease. The largest measures 3.1 x 3.0 x 1.8 cm. IMPRESSION: Findings consistent with multiple hepatic metastases. CT or MRI scan with contrast administration is recommended for further evaluation. Severe gallbladder wall thickening is noted without sonographic Murphy's sign. There is noted surrounding fluid which may represent ascites. This may be due to the ascites and adjacent hepatocellular disease, but cholecystitis cannot be excluded. HIDA scan may be performed for further evaluation. Electronically Signed   By: Marijo Conception, M.D.   On: 12/29/2015 14:26    Procedures Procedures (including critical care time)  CRITICAL CARE Performed by: Elmer Ramp   Total critical care time: 40 minutes  Critical care time was exclusive of separately billable procedures and treating other patients.  Critical care was necessary to treat or prevent imminent or life-threatening deterioration.  Critical care was time spent personally by me on the following activities: development of treatment plan with patient and/or surrogate as well as nursing, discussions with consultants, evaluation of patient's response to treatment, examination of patient, obtaining history from patient or surrogate, ordering and performing treatments and interventions, ordering  and review of laboratory studies, ordering and review of radiographic studies, pulse oximetry and re-evaluation of patient's condition.  Medications Ordered in ED Medications  vancomycin (VANCOCIN) IVPB 750 mg/150 ml premix (not administered)  levofloxacin (LEVAQUIN) IVPB 750 mg (not administered)  aztreonam (AZACTAM) 1 g in dextrose 5 % 50 mL IVPB (not administered)  sodium chloride 0.9 % bolus 1,000 mL (0 mLs Intravenous Stopped 01/16/2016 1539)    And  sodium chloride 0.9 % bolus 500 mL (500 mLs Intravenous New Bag/Given 01/13/2016 1536)    And  sodium chloride 0.9 % bolus 250 mL (not administered)  iopamidol (ISOVUE-370) 76 % injection (80 mLs  Contrast Given 01/18/2016 1210)  0.9 %  sodium chloride infusion (10 mL/hr Intravenous New Bag/Given 01/22/2016 1327)  sodium chloride 0.9 % bolus 1,000 mL (0 mLs Intravenous Stopped 01/07/2016 1418)  levofloxacin (LEVAQUIN) IVPB 750 mg (0 mg Intravenous Stopped 01/01/2016 1507)  aztreonam (AZACTAM) 2 g in dextrose 5 % 50 mL IVPB (0 g Intravenous Stopped 12/29/2015 1437)  vancomycin (VANCOCIN) IVPB 1000 mg/200 mL premix (0 mg Intravenous Stopped 01/16/2016 1531)     Initial Impression / Assessment and Plan / ED Course  I have reviewed the triage vital signs and the nursing notes.  Pertinent labs & imaging results that were available during my care of the patient were reviewed by me and considered in my medical decision making (see chart for details).  Clinical Course     Sepsis - Repeat Assessment  Performed at:    4:42 PM  Vitals     Blood pressure (!) 88/59, pulse 115, temperature 99.1 F (37.3 C), resp. rate 25, height 5\' 7"  (1.702 m), weight 54.4 kg, SpO2 99 %.  Heart:     Regular rate and rhythm  Lungs:    CTA  Capillary Refill:   <2 sec  Peripheral Pulse:   Radial pulse palpable  Skin:     Normal Color     Final Clinical Impressions(s) / ED Diagnoses   Final diagnoses:  RUQ abdominal pain  RUQ abdominal mass    55 year old  male presents today with multisystem failure.  Patient is septic here with questionable respiratory versus biliary origin. Patient also has what appears to be liver metastasis from unknown origin at this point. Patient also severely anemic. Patient will need hospital admission for continued IV antibiotics, transfusion, and further evaluation of abdominal pathology. Surgery consult and who would like hospital admission with further assessment of abdomen with CT scan; they will be happy to consult if surgical pathology is evident. Patient has remained stable while here in the ED, internal medicine to admit.   New Prescriptions New Prescriptions   No medications on file     Okey Regal, PA-C 01/01/2016 [place], PA-C 01/22/2016 [place], PA-C 01/12/2016 1643    Fatima Blank, MD 01/20/16 [phone]

## 2016-01-17 NOTE — ED Notes (Signed)
Unable to obtain Istat lactic acid while Blood is being administered- per lab.

## 2016-01-17 NOTE — Progress Notes (Signed)
  Pharmacy Antibiotic Note  Scott Mahoney is a 55 y.o. male admitted on 01/24/2016 with sepsis.  Pharmacy has been consulted for vancomycin, aztreonam and levaquin dosing. Pt is afebrile but WBC is significantly elevated at 54.9.   Plan: - Vanc 1gm IV x 1 then 750mg  IV Q12H - Aztreo 2gm IV x 1 then 1gm IV Q8H - Levaquin 750mg  IV Q24H - F/u renal fxn, C&S, clinical status and trough at SS  Height: 5\' 7"  (170.2 cm) Weight: 120 lb (54.4 kg) IBW/kg (Calculated) : 66.1  Temp (24hrs), Avg:98.1 F (36.7 C), Min:98.1 F (36.7 C), Max:98.1 F (36.7 C)   Recent Labs Lab 01/07/2016 1119  WBC 54.9*  CREATININE 0.74    Estimated Creatinine Clearance: 83.1 mL/min (by C-G formula based on SCr of 0.74 mg/dL).    Allergies  Allergen Reactions  . Penicillins Anaphylaxis  . Asa [Aspirin] Other (See Comments)    stomach pains and severe discomfort    Antimicrobials this admission: Vanc 11/21>> Aztreo 11/21>> Levaquin 11/21>>  Dose adjustments this admission: N/A  Microbiology results: Pending  Thank you for allowing pharmacy to be a part of this patient's care.  Ricki Vanhandel, Rande Lawman 01/11/2016 12:53 PM

## 2016-01-17 NOTE — ED Triage Notes (Signed)
Pt arrived via EMS from home. Pt reports feeling SOB and lightheaded since 2130 last night. Pt received 10 mg albuterol and 0.5 mg atrovent in route with EMS. Pt denies CP at this time; A&Ox4; NAD noted at this time.

## 2016-01-17 NOTE — ED Notes (Signed)
Pt is aware urine is needed, urinal at bedside.  

## 2016-01-17 NOTE — ED Notes (Signed)
Pt sat 90%; placed on 3.5 L.

## 2016-01-17 NOTE — ED Notes (Signed)
Alysia [id number] [phone] (Pts SO)

## 2016-01-18 ENCOUNTER — Inpatient Hospital Stay (HOSPITAL_COMMUNITY): Payer: Medicaid Other

## 2016-01-18 DIAGNOSIS — R188 Other ascites: Secondary | ICD-10-CM

## 2016-01-18 DIAGNOSIS — D72828 Other elevated white blood cell count: Secondary | ICD-10-CM | POA: Diagnosis not present

## 2016-01-18 DIAGNOSIS — D508 Other iron deficiency anemias: Secondary | ICD-10-CM

## 2016-01-18 DIAGNOSIS — D473 Essential (hemorrhagic) thrombocythemia: Secondary | ICD-10-CM

## 2016-01-18 LAB — BASIC METABOLIC PANEL
Anion gap: 12 (ref 5–15)
BUN: 10 mg/dL (ref 6–20)
CALCIUM: 8.1 mg/dL — AB (ref 8.9–10.3)
CO2: 19 mmol/L — ABNORMAL LOW (ref 22–32)
CREATININE: 0.66 mg/dL (ref 0.61–1.24)
Chloride: 96 mmol/L — ABNORMAL LOW (ref 101–111)
GFR calc Af Amer: >60 mL/min (ref >60)
Glucose, Bld: 128 mg/dL — ABNORMAL HIGH (ref 65–99)
Potassium: 4.3 mmol/L (ref 3.5–5.1)
SODIUM: 127 mmol/L — AB (ref 135–145)

## 2016-01-18 LAB — COMPREHENSIVE METABOLIC PANEL
ALK PHOS: 177 U/L — AB (ref 38–126)
ALT: 23 U/L (ref 17–63)
AST: 68 U/L — ABNORMAL HIGH (ref 15–41)
Albumin: <1 g/dL — ABNORMAL LOW (ref 3.5–5.0)
Anion gap: 12 (ref 5–15)
BUN: 8 mg/dL (ref 6–20)
CALCIUM: 7.9 mg/dL — AB (ref 8.9–10.3)
CHLORIDE: 96 mmol/L — AB (ref 101–111)
CO2: 21 mmol/L — ABNORMAL LOW (ref 22–32)
CREATININE: 0.63 mg/dL (ref 0.61–1.24)
Glucose, Bld: 113 mg/dL — ABNORMAL HIGH (ref 65–99)
Potassium: 4.2 mmol/L (ref 3.5–5.1)
Sodium: 129 mmol/L — ABNORMAL LOW (ref 135–145)
Total Bilirubin: 4.9 mg/dL — ABNORMAL HIGH (ref 0.3–1.2)
Total Protein: 6.1 g/dL — ABNORMAL LOW (ref 6.5–8.1)

## 2016-01-18 LAB — PROTIME-INR
INR: 1.44
PROTHROMBIN TIME: 17.7 s — AB (ref 11.4–15.2)

## 2016-01-18 LAB — CBC WITH DIFFERENTIAL/PLATELET
BASOS ABS: 0 10*3/uL (ref 0.0–0.1)
Basophils Relative: 0 %
EOS ABS: 0 10*3/uL (ref 0.0–0.7)
Eosinophils Relative: 0 %
HCT: 27.6 % — ABNORMAL LOW (ref 39.0–52.0)
Hemoglobin: 9.7 g/dL — ABNORMAL LOW (ref 13.0–17.0)
Lymphocytes Relative: 4 %
Lymphs Abs: 1.5 10*3/uL (ref 0.7–4.0)
MCH: 28.7 pg (ref 26.0–34.0)
MCHC: 35.1 g/dL (ref 30.0–36.0)
MCV: 81.7 fL (ref 78.0–100.0)
MONO ABS: 2.3 10*3/uL — AB (ref 0.1–1.0)
Monocytes Relative: 6 %
NEUTROS PCT: 90 %
Neutro Abs: 33.8 10*3/uL — ABNORMAL HIGH (ref 1.7–7.7)
PLATELETS: 442 10*3/uL — AB (ref 150–400)
RBC: 3.38 MIL/uL — AB (ref 4.22–5.81)
RDW: 15.8 % — ABNORMAL HIGH (ref 11.5–15.5)
WBC: 37.6 10*3/uL — AB (ref 4.0–10.5)

## 2016-01-18 LAB — CBC
HCT: 30.1 % — ABNORMAL LOW (ref 39.0–52.0)
Hemoglobin: 10.6 g/dL — ABNORMAL LOW (ref 13.0–17.0)
MCH: 29.2 pg (ref 26.0–34.0)
MCHC: 35.2 g/dL (ref 30.0–36.0)
MCV: 82.9 fL (ref 78.0–100.0)
PLATELETS: 494 10*3/uL — AB (ref 150–400)
RBC: 3.63 MIL/uL — ABNORMAL LOW (ref 4.22–5.81)
RDW: 16 % — AB (ref 11.5–15.5)
WBC: 39.3 10*3/uL — AB (ref 4.0–10.5)

## 2016-01-18 LAB — ALBUMIN, FLUID (OTHER): Albumin, Fluid: <1 g/dL

## 2016-01-18 LAB — URINE CULTURE: Culture: NO GROWTH

## 2016-01-18 LAB — FERRITIN: Ferritin: 2611 ng/mL — ABNORMAL HIGH (ref 24–336)

## 2016-01-18 LAB — BODY FLUID CELL COUNT WITH DIFFERENTIAL
Eos, Fluid: 0 %
LYMPHS FL: 21 %
MONOCYTE-MACROPHAGE-SEROUS FLUID: 49 % — AB (ref 50–90)
Neutrophil Count, Fluid: 30 % — ABNORMAL HIGH (ref 0–25)
Total Nucleated Cell Count, Fluid: 111 cu mm (ref 0–1000)

## 2016-01-18 LAB — GLUCOSE, CAPILLARY: Glucose-Capillary: 95 mg/dL (ref 65–99)

## 2016-01-18 LAB — RETICULOCYTES
RBC.: 3.38 MIL/uL — ABNORMAL LOW (ref 4.22–5.81)
RETIC COUNT ABSOLUTE: 54.1 10*3/uL (ref 19.0–186.0)
RETIC CT PCT: 1.6 % (ref 0.4–3.1)

## 2016-01-18 LAB — LACTIC ACID, PLASMA
LACTIC ACID, VENOUS: 3.8 mmol/L — AB (ref 0.5–1.9)
LACTIC ACID, VENOUS: 4.4 mmol/L — AB (ref 0.5–1.9)
Lactic Acid, Venous: 2.7 mmol/L (ref 0.5–1.9)
Lactic Acid, Venous: 3.6 mmol/L (ref 0.5–1.9)

## 2016-01-18 LAB — HEMOGLOBIN AND HEMATOCRIT, BLOOD
HCT: 26 % — ABNORMAL LOW (ref 39.0–52.0)
HEMOGLOBIN: 9 g/dL — AB (ref 13.0–17.0)

## 2016-01-18 LAB — APTT: aPTT: 33 seconds (ref 24–36)

## 2016-01-18 MED ORDER — IOPAMIDOL (ISOVUE-300) INJECTION 61%
INTRAVENOUS | Status: AC
  Administered 2016-01-18: 30 mL
  Filled 2016-01-18: qty 100

## 2016-01-18 MED ORDER — SODIUM CHLORIDE 0.9 % IV BOLUS (SEPSIS)
1000.0000 mL | Freq: Once | INTRAVENOUS | Status: AC
  Administered 2016-01-18: 1000 mL via INTRAVENOUS

## 2016-01-18 MED ORDER — SODIUM CHLORIDE 0.9 % IV BOLUS (SEPSIS): 1000.0000 mL | Freq: Once | INTRAVENOUS | Status: DC

## 2016-01-18 MED ORDER — SODIUM CHLORIDE 0.9 % IV SOLN
INTRAVENOUS | Status: DC
  Administered 2016-01-18 – 2016-01-19 (×2): via INTRAVENOUS

## 2016-01-18 MED ORDER — SODIUM CHLORIDE 0.9 % IV BOLUS (SEPSIS): 500.0000 mL | Freq: Once | INTRAVENOUS | Status: DC

## 2016-01-18 MED ORDER — IOPAMIDOL (ISOVUE-300) INJECTION 61%
INTRAVENOUS | Status: AC
  Administered 2016-01-18: 30 mL
  Filled 2016-01-18: qty 30

## 2016-01-18 MED ORDER — IOPAMIDOL (ISOVUE-300) INJECTION 61%
75.0000 mL | Freq: Once | INTRAVENOUS | Status: AC | PRN
  Administered 2016-01-18: 75 mL via INTRAVENOUS

## 2016-01-18 MED ORDER — CHLORDIAZEPOXIDE HCL 25 MG PO CAPS
25.0000 mg | ORAL_CAPSULE | Freq: Once | ORAL | Status: AC
  Administered 2016-01-18: 25 mg via ORAL
  Filled 2016-01-18: qty 1

## 2016-01-18 NOTE — Progress Notes (Signed)
Paged by RN for the lactic acid of 3.8.  Evaluated the patient at bedside. He said that he was feeling dyspneic, but felt better after the duonebs were administered. He is alert and oriented and following commands.   Vitals signs were recorded and were found to be  BP 116/79 (BP Location: Left Arm)   Pulse (!) 125   Temp (!) 100.4 F (38 C) (Rectal)   Resp 18   Ht 5\' 7"  (1.702 m)   Wt 120 lb (54.4 kg)   SpO2 100%   BMI 18.79 kg/m   General exam: cachectic patient on nasal cannula CV: tachycardic, regular rhythm Resp: Mild end expiratory wheezing anteriorly and posteriorly Abd: tense abdomen.  Extremities: warm and well perfused.  Patient had spiked a fever this morning to 101.2, and currently was 100.4. Blood cultures from yesterday showed NGTD. He is currently on levaquin. Earlier, a paracentesis was done and fluid was obtained for analysis. A CBC from this morning shows leukocytosis of 37.6, and hemoglobin of 9.7, which had improved from 4.1 yesterday.   Plan -ordered CXR  -repeated blood cultures  -ordered repeat CBC and BMET. -He has fluids running at 100 ml/hr  -repeated lactic acid at 10 PM and 1 AM -transferred patient to step down for closer monitoring - Given his tachycardia and dyspnea, I was concerned about a PE, but patient had a CT angiogram yesterday which ruled out a PE.

## 2016-01-18 NOTE — Progress Notes (Signed)
CRITICAL VALUE ALERT  Critical value received:  Lactic Acid 2.7  Date of notification:  01/18/2016   Time of notification:  0036  Critical value read back:Yes.    Nurse who received alert:  Benay Pike  MD notified (1st page):  Dr. Danford Bad   Time of first page:  0038  MD notified (2nd page):  Time of second page:  Responding MD:  Dr.  Danford Bad   Time MD responded:  [phone]

## 2016-01-18 NOTE — Progress Notes (Signed)
CRITICAL VALUE ALERT  Critical value received:  Lactic Acid 3.6  Date of notification:  01/18/2016   Time of notification:  0512  Critical value read back:Yes.    Nurse who received alert: Benay Pike  MD notified (1st page):  Dr. Danford Bad  Time of first page:  0518  MD notified (2nd page):  Time of second page:  Responding MD:  Dr. Danford Bad   Time MD responded:  [phone]

## 2016-01-18 NOTE — Progress Notes (Signed)
Subjective: Feels terrible, sweaty, shaking chills, shortness of breath for the last 2 hours.  Has had similar episodes for the past week.  Does not want Tylenol because "he is allergic to all kinds of aspirin", even though different medication classes were explained.  Objective:  Vital signs in last 24 hours: Vitals:   12/31/2015 2106 01/18/16 0500 01/18/16 0737 01/18/16 0830  BP: 96/64 112/76 111/72   Pulse: (!) 113 (!) 119 (!) 122   Resp: 16 18    Temp: 98.6 F (37 C) 98.3 F (36.8 C) 98 F (36.7 C) (!) 101.2 F (38.4 C)  TempSrc: Oral Oral Oral Rectal  SpO2: 100% 98% 99%   Weight:      Height:       Physical Exam  Constitutional: He is oriented to person, place, and time.  Thin, ill appearing man with sweat beading on his forehead  Eyes: Scleral icterus is present.  Cardiovascular: Regular rhythm and normal heart sounds.   Tachycardic  Pulmonary/Chest: Effort normal. He has wheezes.  Abdominal:  Belly tense RUQ tenderness to palpation Hepatomegaly  Musculoskeletal: He exhibits no edema or tenderness.  Neurological: He is alert and oriented to person, place, and time.  Skin: No pallor.  Psychiatric: He has a normal mood and affect. His behavior is normal.   CBC Latest Ref Rng & Units 01/18/2016 01/22/2016 01/08/2016  WBC 4.0 - 10.5 K/uL 37.6(H) - 54.9(HH)  Hemoglobin 13.0 - 17.0 g/dL 9.7(L) 9.0(L) 4.1(LL)  Hematocrit 39.0 - 52.0 % 27.6(L) 26.0(L) 11.9(L)  Platelets 150 - 400 K/uL 442(H) - 686(H)   BMP Latest Ref Rng & Units 01/18/2016 01/06/2016 07/17/2015  Glucose 65 - 99 mg/dL 113(H) 105(H) 109(H)  BUN 6 - 20 mg/dL 8 16 8   Creatinine 0.61 - 1.24 mg/dL 0.63 0.74 0.89  Sodium 135 - 145 mmol/L 129(L) 127(L) 136  Potassium 3.5 - 5.1 mmol/L 4.2 4.5 4.0  Chloride 101 - 111 mmol/L 96(L) 90(L) 100(L)  CO2 22 - 32 mmol/L 21(L) 23 25  Calcium 8.9 - 10.3 mg/dL 7.9(L) 8.4(L) 9.0   Component     Latest Ref Rng & Units 01/20/2016 01/13/2016 01/18/2016         1:23 PM  11:27 PM   Lactic Acid, Venous     0.5 - 1.9 mmol/L 5.65 (HH) 2.7 (HH) 3.6 (HH)    Peripheral blood smear reviewed with hematologist Dr Beryle Beams.  Abundant mature neutrophils with some toxic granulations.  No blasts, rare mature lymphs.  Assessment/Plan:  Principal Problem:   Symptomatic anemia Active Problems:   Liver masses   Lung nodules   Anemia  55 year old man with history of severe alcohol abuse who presents with anemia, extreme leukocytosis, and thrombocythemia and has numerous new lung and liver masses concerning for metastatic disease.  Un  #Symptomatic Anemia Received 2U pRBCs with greater than expected increase in Hgb.  Dyspnea improved prior to him become febrile this morning.  Concern for hemolysis with elevated bili and LDH, but DAT negative. -Monitor daily CBC  #Infection? Febrile with extreme leukocytosis.  Has numerous lung and liver masses, most likely metastatic disease but possibly some disseminated atypical infection.  Possible new ascites, SBP could be source.  No diarrhea to suggest C Diff, UA non-infected.  Could be acute cholecystitis with gallbladder wall thickening shown on [place]. -Continue empiric levofloxacin -IVF to support hemodynamics as needed -F/u blood cultures -CT AP w/ contrast -Consider paracentesis -Consider surgery consult after CT  #Liver Masses #Lung Masses  Likely metastatic disease vs ?atypical infection. -CT AP w/ contrast  #Alcohol Withdrawal? States last drink was 2-3 weeks ago, but his tachycardia, diaphoresis could be due to alcohol withdrawal if his is inaccurately reporting his recent drinking. -Chlordiazepoxide 25 mg  Dispo: Anticipated discharge in approximately 3 day(s).   Minus Liberty, MD 01/18/2016, 9:58 AM Pager: [phone]

## 2016-01-18 NOTE — Progress Notes (Addendum)
CRITICAL VALUE ALERT  Critical value received:  Lactic Acid  Date of notification:  01/18/16  Time of notification:  [id number]  Critical value read back:Yes.    Nurse who received alert:  Betha Loa  MD notified (1st page):  IM Resident after 1700 pager; 1st  Time of first page:  1740  MD notified (2nd page):IM Resident after 1700 pager; 2nd  Time of second page:1745  Responding MD:  IM resident  Time MD responded: [phone]

## 2016-01-18 NOTE — Progress Notes (Signed)
Temp 100.4, rectal. Wheezing lung sounds noted. Duoneb administered. After duoneb, pt continues to c/o sob. Paged IM resident to make aware. O2 sats 100% on 2L O2. Continuing to monitor pt. Resident MD coming to evaluate pt.

## 2016-01-18 NOTE — Progress Notes (Addendum)
   01/18/16 0737  Vitals  Temp 98 F (36.7 C)  Temp Source Oral  BP 111/72  MAP (mmHg) 80  BP Location Right Arm  BP Method Automatic  Patient Position (if appropriate) Lying  Pulse Rate (!) 122  Pulse Rate Source Monitor  ECG Heart Rate (!) 111  Oxygen Therapy  SpO2 99 %  O2 Device Nasal Cannula  O2 Flow Rate (L/min) 2 L/min  Nurse in room receiving report with night nurse. Pts face is beading with sweat and pt is shivering. Vital signs taken as shown above. CBG 95.  IM resident made aware and said he will be right up to see pt. No new orders given at this time.  0830 Rectal temp taken; 101.2. Made IM resident aware.

## 2016-01-18 NOTE — Procedures (Signed)
Paracentesis Procedure Note  Indications:  Ascites, fever  Procedure Details  Informed consent was obtained after explanation of the risks and benefits of the procedure, refer to the consent documentation.  Time-out was performed immediately prior to the procedure.  The head of the bed was placed 30-45 degrees above level and the meniscus of the ascites was evaluated by bedside ultrasound and a large area of ascitic fluid was identified in the right lower quadrant.  Local anesthesia with 1 percent lidocaine was introduced subcutaneously then deep to the skin until the parietal peritoneum was anesthetized. A parcentesis needle was introduced into this site until ascitic fluid was encountered.  Ascitic fluid and the needle were removed with minimal bleeding.  A sterile bandage was placed after holding pressure.   Findings: 20 mL of clear yellow ascites fluid was obtained.  The ascites fluid was sent for cell count and differential, albumin, culture, and cytology.        Condition:   The patient tolerated the procedure well and remains in the same condition as pre-procedure.  Complications: None; patient tolerated the procedure well.

## 2016-01-19 ENCOUNTER — Inpatient Hospital Stay (HOSPITAL_COMMUNITY): Payer: Medicaid Other

## 2016-01-19 DIAGNOSIS — J9601 Acute respiratory failure with hypoxia: Secondary | ICD-10-CM

## 2016-01-19 DIAGNOSIS — R918 Other nonspecific abnormal finding of lung field: Secondary | ICD-10-CM

## 2016-01-19 DIAGNOSIS — R160 Hepatomegaly, not elsewhere classified: Secondary | ICD-10-CM

## 2016-01-19 DIAGNOSIS — D649 Anemia, unspecified: Principal | ICD-10-CM

## 2016-01-19 LAB — POCT I-STAT 3, ART BLOOD GAS (G3+)
Acid-base deficit: 3 mmol/L — ABNORMAL HIGH (ref 0.0–2.0)
Bicarbonate: 21.7 mmol/L (ref 20.0–28.0)
Bicarbonate: 18.6 mmol/L — ABNORMAL LOW (ref 20.0–28.0)
O2 SAT: 95 %
O2 SAT: 99 %
PCO2 ART: 26.7 mmHg — AB (ref 32.0–48.0)
PCO2 ART: 25.7 mmHg — AB (ref 32.0–48.0)
PH ART: 7.467 — AB (ref 7.350–7.450)
PO2 ART: 127 mmHg — AB (ref 83.0–108.0)
Patient temperature: 98.6
Patient temperature: 98.9
TCO2: 22 mmol/L (ref 0–100)
TCO2: 19 mmol/L (ref 0–100)
pH, Arterial: 7.517 — ABNORMAL HIGH (ref 7.350–7.450)
pO2, Arterial: 65 mmHg — ABNORMAL LOW (ref 83.0–108.0)

## 2016-01-19 LAB — BASIC METABOLIC PANEL
ANION GAP: 9 (ref 5–15)
ANION GAP: 14 (ref 5–15)
BUN: 10 mg/dL (ref 6–20)
BUN: 11 mg/dL (ref 6–20)
CALCIUM: 7.8 mg/dL — AB (ref 8.9–10.3)
CALCIUM: 8.2 mg/dL — AB (ref 8.9–10.3)
CO2: 20 mmol/L — ABNORMAL LOW (ref 22–32)
CO2: 17 mmol/L — ABNORMAL LOW (ref 22–32)
Chloride: 101 mmol/L (ref 101–111)
Chloride: 99 mmol/L — ABNORMAL LOW (ref 101–111)
Creatinine, Ser: 0.58 mg/dL — ABNORMAL LOW (ref 0.61–1.24)
Creatinine, Ser: 0.71 mg/dL (ref 0.61–1.24)
GFR calc Af Amer: >60 mL/min (ref >60)
GLUCOSE: 111 mg/dL — AB (ref 65–99)
GLUCOSE: 111 mg/dL — AB (ref 65–99)
Potassium: 4.6 mmol/L (ref 3.5–5.1)
Potassium: 4.5 mmol/L (ref 3.5–5.1)
Sodium: 130 mmol/L — ABNORMAL LOW (ref 135–145)
Sodium: 130 mmol/L — ABNORMAL LOW (ref 135–145)

## 2016-01-19 LAB — CBC
HCT: 26.4 % — ABNORMAL LOW (ref 39.0–52.0)
HCT: 32.6 % — ABNORMAL LOW (ref 39.0–52.0)
Hemoglobin: 9 g/dL — ABNORMAL LOW (ref 13.0–17.0)
Hemoglobin: 11.1 g/dL — ABNORMAL LOW (ref 13.0–17.0)
MCH: 27.9 pg (ref 26.0–34.0)
MCH: 28.3 pg (ref 26.0–34.0)
MCHC: 34.1 g/dL (ref 30.0–36.0)
MCHC: 34 g/dL (ref 30.0–36.0)
MCV: 81.7 fL (ref 78.0–100.0)
MCV: 83.2 fL (ref 78.0–100.0)
PLATELETS: 400 10*3/uL (ref 150–400)
PLATELETS: 466 10*3/uL — AB (ref 150–400)
RBC: 3.23 MIL/uL — ABNORMAL LOW (ref 4.22–5.81)
RBC: 3.92 MIL/uL — ABNORMAL LOW (ref 4.22–5.81)
RDW: 16.1 % — AB (ref 11.5–15.5)
RDW: 16.6 % — AB (ref 11.5–15.5)
WBC: 35.6 10*3/uL — AB (ref 4.0–10.5)
WBC: 38.6 10*3/uL — AB (ref 4.0–10.5)

## 2016-01-19 LAB — LACTIC ACID, PLASMA
LACTIC ACID, VENOUS: 2.7 mmol/L — AB (ref 0.5–1.9)
Lactic Acid, Venous: 3.1 mmol/L (ref 0.5–1.9)

## 2016-01-19 LAB — HAPTOGLOBIN: Haptoglobin: 514 mg/dL — ABNORMAL HIGH (ref 34–200)

## 2016-01-19 LAB — HEPATITIS B SURFACE ANTIGEN: HEP B S AG: NEGATIVE

## 2016-01-19 LAB — HIV ANTIBODY (ROUTINE TESTING W REFLEX): HIV SCREEN 4TH GENERATION: NONREACTIVE

## 2016-01-19 MED ORDER — SODIUM CHLORIDE 0.9 % IV BOLUS (SEPSIS)
1000.0000 mL | Freq: Once | INTRAVENOUS | Status: AC
  Administered 2016-01-19: 1000 mL via INTRAVENOUS

## 2016-01-19 MED ORDER — MORPHINE SULFATE (PF) 2 MG/ML IV SOLN
2.0000 mg | INTRAVENOUS | Status: DC | PRN
  Administered 2016-01-19: 2 mg via INTRAVENOUS
  Administered 2016-01-19 – 2016-01-20 (×2): 4 mg via INTRAVENOUS
  Administered 2016-01-20 (×2): 2 mg via INTRAVENOUS
  Administered 2016-01-21: 4 mg via INTRAVENOUS
  Administered 2016-01-23: 2 mg via INTRAVENOUS
  Filled 2016-01-19 (×2): qty 2
  Filled 2016-01-19: qty 1
  Filled 2016-01-19: qty 2
  Filled 2016-01-19 (×2): qty 1

## 2016-01-19 MED ORDER — LORAZEPAM 2 MG/ML IJ SOLN
1.0000 mg | Freq: Once | INTRAMUSCULAR | Status: DC
  Filled 2016-01-19: qty 1

## 2016-01-19 MED ORDER — IPRATROPIUM-ALBUTEROL 0.5-2.5 (3) MG/3ML IN SOLN
3.0000 mL | Freq: Four times a day (QID) | RESPIRATORY_TRACT | Status: DC
  Administered 2016-01-19 – 2016-01-20 (×5): 3 mL via RESPIRATORY_TRACT
  Filled 2016-01-19 (×8): qty 3

## 2016-01-19 MED ORDER — FUROSEMIDE 10 MG/ML IJ SOLN
40.0000 mg | Freq: Once | INTRAMUSCULAR | Status: AC
  Administered 2016-01-19: 40 mg via INTRAVENOUS
  Filled 2016-01-19: qty 4

## 2016-01-19 NOTE — Progress Notes (Signed)
Subjective: Overnight he had an episode of dyspnea which was improved with nebulizer treatment.  CXR without significant pulmonary edema or consolidation, possible mild progression of LLL infiltrate.  He was transferred from telemetry to SDU, and received 1L NS for elevated lactic acid.  This morning, his dyspnea is improved and he is hungry for breakfast.  I discussed the status of his hospitalization with him;  So far, we have found no infection in his lungs, ascites, urine, or blood, but we are still giving him empiric antibiotics.  The imaging findings suggest that by far the most likely explanation is cancer in his liver and lungs, but we don't know the primary, and won't know the diagnosis for sure without a biopsy.  Regardless of the diagnosis, he is very sick.  I suggested that he talk to both oncology and palliative care, either in the hospital or as an outpatient, and he was amenable to both.  Says he wants me to "give it to him straight, he can take it".  Objective:  Vital signs in last 24 hours: Vitals:   01/19/16 0928 01/19/16 0930 01/19/16 0945 01/19/16 1000  BP: 121/86   108/84  Pulse: (!) 126 (!) 126 (!) 125 (!) 126  Resp: (!) 30 (!) 37 (!) 29 (!) 36  Temp:      TempSrc:      SpO2: 100% 100% 100% 100%  Weight:      Height:       Physical Exam  Constitutional: He is oriented to person, place, and time.  Thin, chronically ill appearing, mildly diaphoretic, eating breakfast in no acute distress  Eyes: Scleral icterus is present.  Cardiovascular: Regular rhythm and normal heart sounds.   Tachycardic  Pulmonary/Chest: Effort normal. He has no wheezes.  Abdominal:  Belly tense RUQ tenderness to palpation Hepatomegaly  Musculoskeletal: He exhibits no edema or tenderness.  Neurological: He is alert and oriented to person, place, and time.  Skin: No pallor.  Psychiatric: He has a normal mood and affect. His behavior is normal.   CBC Latest Ref Rng & Units 01/19/2016  01/18/2016 01/18/2016  WBC 4.0 - 10.5 K/uL 35.6(H) 39.3(H) 37.6(H)  Hemoglobin 13.0 - 17.0 g/dL 9.0(L) 10.6(L) 9.7(L)  Hematocrit 39.0 - 52.0 % 26.4(L) 30.1(L) 27.6(L)  Platelets 150 - 400 K/uL 400 494(H) 442(H)   BMP Latest Ref Rng & Units 01/19/2016 01/18/2016 01/18/2016  Glucose 65 - 99 mg/dL 111(H) 128(H) 113(H)  BUN 6 - 20 mg/dL 10 10 8   Creatinine 0.61 - 1.24 mg/dL 0.58(L) 0.66 0.63  Sodium 135 - 145 mmol/L 130(L) 127(L) 129(L)  Potassium 3.5 - 5.1 mmol/L 4.6 4.3 4.2  Chloride 101 - 111 mmol/L 101 96(L) 96(L)  CO2 22 - 32 mmol/L 20(L) 19(L) 21(L)  Calcium 8.9 - 10.3 mg/dL 7.8(L) 8.1(L) 7.9(L)   Component     Latest Ref Rng & Units 01/18/2016  Fluid Type-FCT      FLUID  Color, Fluid     YELLOW YELLOW (A)  Appearance, Fluid     CLEAR CLEAR  WBC, Fluid     0 - 1,000 cu mm 111  Neutrophil Count, Fluid     0 - 25 % 30 (H)  Lymphs, Fluid     % 21  Monocyte-Macrophage-Serous Fluid     50 - 90 % 49 (L)  Eos, Fluid     % 0   Component     Latest Ref Rng & Units 01/18/2016  Albumin, Fluid  g/dL <1.0  Fluid Type-FALB      FLUID   Component     Latest Ref Rng & Units 01/18/2016 01/18/2016 01/19/2016 01/19/2016         3:59 PM  9:54 PM  1:13 AM  3:04 AM  Lactic Acid, Venous     0.5 - 1.9 mmol/L 3.8 (HH) 4.4 (HH) 3.1 (HH) 2.7 (HH)   Urine culture negative.  Peripheral blood smear reviewed with hematologist Dr Beryle Beams.  Abundant mature neutrophils with some toxic granulations.  No blasts, rare mature lymphs.   Assessment/Plan:  Principal Problem:   Symptomatic anemia Active Problems:   Liver masses   Lung nodules   Anemia  55 year old man with history of severe alcohol abuse who presents with anemia, extreme leukocytosis, and thrombocythemia and has numerous new lung and liver masses concerning for metastatic disease.  #Infection? Febrile with marked leukocytosis.  Has numerous lung and liver masses, most likely metastatic disease but possibly some  disseminated atypical infection.  No diarrhea to suggest C Diff, UA non-infected, no consolidation on CXR.  Cholecystitis with gallbladder wall thickening shown on [place].  Blood cultures no growth 1 day.  Ascites fluid cell counts not diagnostitc of SBP.  His elevated lactic acid is most likely from necrotizing tumors with reactive leukocytosis from metastatic disease. -Continue empiric levofloxacin -IVF to support hemodynamics as needed -F/u blood cultures, ascites culture, ascites cytology -CT AP w/ contrast -Consider paracentesis -Consider surgery consult after CT  #Symptomatic Anemia Received 2U pRBCs with greater than expected increase in Hgb, held steady after transfusion.  Initial concern for hemolysis with elevated bili and LDH, but DAT negative, high haptoglobin, Hgb stable, does not seem to be actively hemolyzing. -Monitor daily CBC -Transfuse as needed fpr Hgb >7.0  #Liver Masses #Lung Masses Likely metastatic disease vs ?atypical infection. -Will discuss percutaneous biopsy with patient -F/u ascites cytology -Palliative care consult -Inpatient oncology consult vs outpatient referral  #Dyspnea #COPD He has history of reactive airway disease.  His dyspnea on admission was likely symptomatic anemia, but he also seems to have some contribution of RAD.  Does not seem to have pneumonia, and is still on empiric levofloxacin. -Q6H DuoNebs  Dispo: Anticipated discharge in approximately 3 day(s).   Minus Liberty, MD 01/19/2016, 10:38 AM Pager: [phone]

## 2016-01-19 NOTE — Progress Notes (Signed)
Over the course of the morning, Mr Scott Mahoney has acutely deteriorated.  He is more somnolent, but easily arousable, alert, and oriented.  Complaining only of being unable to catch his breath.  His significant other, Scott Mahoney, (cell [phone]) is his medical decision maker.  He said he wants to talked to her before decided on his code status, and remains full code for now.  I talked to Ms Scott Mahoney on the phone at his request and urged her to come to the hospital to visit him this evening, but she said that she would be unlikely to make it her before first thing tomorrow morning.  Vitals:   01/19/16 1100 01/19/16 1309  BP: 104/86 (!) 121/95  Pulse: (!) 125 (!) 143  Resp: (!) 29 (!) 39  Temp: 98.9 F (37.2 C)    Physical Exam  Constitutional: He is oriented to person, place, and time.  Cachetic, chronically ill-appearing man, somnlolent, in acute respiratory distress  Cardiovascular:  Tachycardic, regular rhythm Extremities cold  Pulmonary/Chest:  Tachypneic, in acute respiratory distress, with accessory muscle use Crackles in bilateral bases L>R Reduced breath sounds in R base  Abdominal:  Tense, RUQ tenderness  Musculoskeletal: He exhibits no edema or tenderness.  Neurological: He is alert and oriented to person, place, and time.  Psychiatric: He has a normal mood and affect. His behavior is normal.   ABG    Component Value Date/Time   PHART 7.517 (H) 01/19/2016 1224   PCO2ART 26.7 (L) 01/19/2016 1224   PO2ART 65.0 (L) 01/19/2016 1224   HCO3 21.7 01/19/2016 1224   TCO2 22 01/19/2016 1224   O2SAT 95.0 01/19/2016 1224   CBC Latest Ref Rng & Units 01/19/2016 01/19/2016 01/18/2016  WBC 4.0 - 10.5 K/uL 38.6(H) 35.6(H) 39.3(H)  Hemoglobin 13.0 - 17.0 g/dL 11.1(L) 9.0(L) 10.6(L)  Hematocrit 39.0 - 52.0 % 32.6(L) 26.4(L) 30.1(L)  Platelets 150 - 400 K/uL 466(H) 400 494(H)   BMP Latest Ref Rng & Units 01/19/2016 01/19/2016 01/18/2016  Glucose 65 - 99 mg/dL 111(H) 111(H) 128(H)   BUN 6 - 20 mg/dL 11 10 10   Creatinine 0.61 - 1.24 mg/dL 0.71 0.58(L) 0.66  Sodium 135 - 145 mmol/L 130(L) 130(L) 127(L)  Potassium 3.5 - 5.1 mmol/L 4.5 4.6 4.3  Chloride 101 - 111 mmol/L 99(L) 101 96(L)  CO2 22 - 32 mmol/L 17(L) 20(L) 19(L)  Calcium 8.9 - 10.3 mg/dL 8.2(L) 7.8(L) 8.1(L)    CXR No consolidation or pulmonary edema.  No pneumothorax.  Enlarging R pleural effusion  A Acutely progressing hypoxic respiratory failure in cachectic man with what is almost certainly advanced metastatic disease.  His acute decompensation could be due to pulmonary edema, with hypoxia and crackles.  Concern for PE with malignancy, though he has been on lovenox and had negative CTA on admission.  P BiPAP Lasix 40 IV EKG Trial 1 hr BiPAP, reassess Likely PCCM consult

## 2016-01-19 NOTE — Consult Note (Signed)
Name: Scott Mahoney MRN: [id number] DOB: April 16, 1960    ADMISSION DATE:  01/05/2016 CONSULTATION DATE:  11/23  REFERRING MD :  Graciella Freer   CHIEF COMPLAINT:  Respiratory failure   BRIEF PATIENT DESCRIPTION: 55yo male with hx asthma, COPD, pancreatitis, ETOH abuse presented 11/21 with increasing SOB, cough, 50lbs weight loss. Found to have severe anemia with hgb 4, tempt 101.2, tachycardia.  He was admitted by IMTS and abd u/s revealed multiple hepatic masses.  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   Pt has had progressive respiratory failure despite empiric abx, correction of anemia and attempts at diuresis and on 11/23 pt required bipap for increased WOB.  PCCM consulted.   SIGNIFICANT EVENTS   STUDIES:  CT abd/pelvis 11/22>>> Extensive neoplastic changes noted. The liver is enlarged with a lobular contour consistent with cirrhosis. There are mass lesions throughout the entire liver involving all lobes in segments. There is a confluent mass in the right lobe with smaller lesions elsewhere throughout the liver. There is extensive peripancreatic, periportal, retrocrural, and to a lesser extent retroperitoneal adenopathy. There is a mass either within or abutting the body of the pancreas measuring 3.3 x 2.9 cm. This lesion has apparent central necrosis as does adenopathy in the retrocrural and left mesenteric regions. This lesion probably represents adenopathy abutting the pancreas as opposed to arising from the pancreas given lack of pancreatic duct dilatation. There is extensive ascites. There are free-flowing pleural effusions with small nodular opacities in the lungs bilaterally, likely metastatic foci. There is an apparent metastasis in the anterior superior aspect of the L3 vertebral body. Given apparent cirrhosis, the findings could well be due to  hepatic cellular carcinoma with widespread metastases. If the lesion at the level of the body of the pancreas actually arises from the pancreas, a pancreatic primary in the face of cirrhosis with metastatic disease is a differential consideration. It should be noted that many of the liver lesions would be readily amenable to tissue sampling percutaneously.   HISTORY OF PRESENT ILLNESS:  55yo male with hx asthma, COPD, pancreatitis, ETOH abuse presented 11/21 with increasing SOB, cough, 50lbs weight loss. Found to have severe anemia with hgb 4, tempt 101.2, tachycardia.  He was admitted by IMTS and abd u/s revealed multiple hepatic masses.  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   Pt has had progressive respiratory failure and on 11/23 pt required bipap for increased WOB.  PCCM consulted.    Currently feeling better on bipap.  Feels weak overall.  Feels as though he has been declining over last several months.  Hasnt wanted to drink or smoke.  50lbs weight loss, overall weakness and malaise.   PAST MEDICAL HISTORY :   has a past medical history of Asthma; Black lung ([place]); COPD (chronic obstructive pulmonary disease) ([place]); Multiple gastric ulcers; and Pancreatitis.  has no past surgical history on file. Prior to Admission medications   Medication Sig Start Date End Date Taking? Authorizing Provider  omeprazole (PRILOSEC) 40 MG capsule Take 1 capsule (40 mg total) by mouth daily. Reported on 07/17/2015 07/18/15  Yes Jessica L Focht, PA  ranitidine (ZANTAC) 150 MG tablet Take 150 mg by mouth at bedtime.  06/26/15  Yes Historical Provider, MD  cetirizine-pseudoephedrine (ZYRTEC-D) 5-120 MG tablet Take 1 tablet by mouth daily. Patient not  taking: Reported on 01/06/2016 07/18/15   Jessica L Focht, PA  COMBIVENT RESPIMAT 20-100 MCG/ACT AERS respimat Inhale 1 puff into the lungs every 6  (six) hours as needed for wheezing or shortness of breath.  06/26/15   Historical Provider, MD  predniSONE (DELTASONE) 20 MG tablet Take 3 tablets (60 mg total) by mouth daily. Patient not taking: Reported on 01/16/2016 07/18/15   Fraser Din Focht, PA  PROVENTIL HFA 108 (90 Base) MCG/ACT inhaler Inhale 2 puffs into the lungs every 6 (six) hours as needed for wheezing or shortness of breath.  06/26/15   Historical Provider, MD  QVAR 80 MCG/ACT inhaler Inhale 2 puffs into the lungs 2 (two) times daily. 06/26/15   Historical Provider, MD   Allergies  Allergen Reactions  . Penicillins Anaphylaxis  . Asa [Aspirin] Other (See Comments)    stomach pains and severe discomfort    FAMILY HISTORY:  family history is not on file. SOCIAL HISTORY:  reports that he has been smoking Cigarettes.  He has been smoking about 0.25 packs per day. He has never used smokeless tobacco. He reports that he drinks alcohol. He reports that he uses drugs, including Marijuana.  REVIEW OF SYSTEMS:   As per HPI - All other systems reviewed and were neg.    SUBJECTIVE:   VITAL SIGNS: Temp:  [98.3 F (36.8 C)-100.9 F (38.3 C)] 99.5 F (37.5 C) (11/23 1600) Pulse Rate:  [111-143] 137 (11/23 1614) Resp:  [20-39] 34 (11/23 1614) BP: (94-121)/(72-95) 106/83 (11/23 1614) SpO2:  [97 %-100 %] 100 % (11/23 1614) FiO2 (%):  [40 %] 40 % (11/23 1521) Weight:  [54.1 kg (119 lb 4.3 oz)] 54.1 kg (119 lb 4.3 oz) (11/22 2100)  PHYSICAL EXAMINATION: General:  Frail, cachectic male, respiratory distress on bipap  Neuro:  Awake, alert, appropriate, MAE  HEENT:  Mm dry, bipap  Cardiovascular:  s1s2 tachy, NSR 140's  Lungs:  resps labored, tachypneic on bipap, diminished, scattered crackles  Abdomen:  Mildly distended, ascites  Musculoskeletal:  Warm and dry, no edema     Recent Labs Lab 01/18/16 1926 01/19/16 0304 01/19/16 1313  NA 127* 130* 130*  K 4.3 4.6 4.5  CL 96* 101 99*  CO2 19* 20* 17*  BUN 10 10 11    CREATININE 0.66 0.58* 0.71  GLUCOSE 128* 111* 111*    Recent Labs Lab 01/18/16 1926 01/19/16 0304 01/19/16 1313  HGB 10.6* 9.0* 11.1*  HCT 30.1* 26.4* 32.6*  WBC 39.3* 35.6* 38.6*  PLT 494* 400 466*   Ct Abdomen Pelvis W Contrast  Result Date: 01/18/2016 CLINICAL DATA:  60 pound weight loss and abdominal tenderness EXAM: CT ABDOMEN AND PELVIS WITH CONTRAST TECHNIQUE: Multidetector CT imaging of the abdomen and pelvis was performed using the standard protocol following bolus administration of intravenous contrast. CONTRAST:  68mL ISOVUE-300 IOPAMIDOL (ISOVUE-300) INJECTION 61% CT angiogram chest January 17, 2016 FINDINGS: Lower chest: There are moderate pleural effusions bilaterally. There is a 3 mm nodular opacity in the superior segment of the right lower lobe on axial slice 1 series 3. There is a 3 mm nodular opacity in the inferior lingula on axial slice 2 series 3. There is atelectatic change in the left lung base. Hepatobiliary: The liver measures 21.5 cm in length. The liver has a lobular contour, likely due to underlying hepatic cirrhosis. There are mass lesions throughout the liver involving all lobes in segments. Lesions range in size from as small as 5 mm to a confluent area in  the right lobe measuring 9.3 x 6.3 cm. This appearance is consistent with widespread hepatic metastatic disease. The gallbladder appears somewhat contracted. The gallbladder is surrounded by ascites. There is no appreciable biliary duct dilatation. Pancreas: There is a mass either arising from or abutting the body of the pancreas measuring 3.3 x 2.9 cm. There is no pancreatic duct dilatation. No pancreatic inflammation seen. There are multiple enlarged lymph nodes in the peripancreatic region Spleen: No splenic lesions are evident. Adrenals/Urinary Tract: Adrenals appear normal bilaterally. Kidneys bilaterally show no evidence of mass or hydronephrosis. There is no renal or ureteral calculus on either side.  Urinary bladder is midline with wall thickness within normal limits. Stomach/Bowel: There is no appreciable bowel wall or mesenteric thickening. There is no appreciable bowel obstruction. No free air or portal venous air. There is moderate stool throughout the colon which potentially could mask a colonic lesion. Vascular/Lymphatic: There is atherosclerotic calcification in the aorta and proximal common iliac arteries. No abdominal aortic aneurysm. Major mesenteric vessels appear normal. There is extensive adenopathy in the periportal and peripancreatic regions. There is adenopathy with what appears to be central necrosis inferior to the tail of the pancreas in the left mid abdomen measuring 4.3 x 2.4 cm. The largest individual lymph node in the periportal region measures 2.0 x 1.6 cm. There are multiple retroperitoneal lymph nodes which are borderline prominent in size. A lymph node between the aorta and inferior vena cava measures 1.7 x 1.4 cm. There is a right retrocrural lymph noted adjacent to the aorta at the diaphragmatic hiatus measuring 2.7 x 2.6 cm with a necrotic central region. There is no appreciable pelvic adenopathy. Reproductive: Prostate and seminal vesicles appear normal in size and contour. There is mild calcification in the prostate consistent with small calculi. Other: There is extensive ascites throughout the abdomen and pelvis. There is no abscess in the abdomen or pelvis. There is no periappendiceal region inflammation. Appendix not seen. Musculoskeletal: There is a lesion in the anterior aspect of the L3 vertebral body superiorly, suspicious for a metastatic focus. No other bony metastases identified. There is no intramuscular lesion. There is soft tissue edema, likely anasarca. IMPRESSION: Extensive neoplastic changes noted. The liver is enlarged with a lobular contour consistent with cirrhosis. There are mass lesions throughout the entire liver involving all lobes in segments. There is a  confluent mass in the right lobe with smaller lesions elsewhere throughout the liver. There is extensive peripancreatic, periportal, retrocrural, and to a lesser extent retroperitoneal adenopathy. There is a mass either within or abutting the body of the pancreas measuring 3.3 x 2.9 cm. This lesion has apparent central necrosis as does adenopathy in the retrocrural and left mesenteric regions. This lesion probably represents adenopathy abutting the pancreas as opposed to arising from the pancreas given lack of pancreatic duct dilatation. There is extensive ascites. There are free-flowing pleural effusions with small nodular opacities in the lungs bilaterally, likely metastatic foci. There is an apparent metastasis in the anterior superior aspect of the L3 vertebral body. Given apparent cirrhosis, the findings could well be due to hepatic cellular carcinoma with widespread metastases. If the lesion at the level of the body of the pancreas actually arises from the pancreas, a pancreatic primary in the face of cirrhosis with metastatic disease is a differential consideration. It should be noted that many of the liver lesions would be readily amenable to tissue sampling percutaneously. No bowel obstruction or bowel wall thickening is evident. No abscess. There is  aortoiliac atherosclerosis. There is evidence of anasarca. Electronically Signed   By: Lowella Grip III M.D.   On: 01/18/2016 11:49   Dg Chest Port 1 View  Result Date: 01/19/2016 CLINICAL DATA:  55 year old with current history of COPD, presenting with acute onset of tachypnea EXAM: PORTABLE CHEST 1 VIEW COMPARISON:  01/18/2016 and earlier, including CT chest 01/09/2016 and 12/16/2015. FINDINGS: Cardiomediastinal silhouette unremarkable, unchanged. Lungs clear. Bronchovascular markings normal. Pulmonary vascularity normal. No visible pleural effusions. No pneumothorax. Old healed right rib fractures again noted. IMPRESSION: No acute cardiopulmonary  disease. Electronically Signed   By: Evangeline Dakin M.D.   On: 01/19/2016 12:37   Dg Chest Port 1 View  Result Date: 01/18/2016 CLINICAL DATA:  Dyspnea. EXAM: PORTABLE CHEST 1 VIEW COMPARISON:  Chest x-ray dated 12/16/2015, 07/17/2015, 09/10/2012 and 03/31/2007 and chest CT dated 01/22/2016 FINDINGS: There is further accentuation of the interstitial markings and peribronchial thickening in the left lower lobe without consolidation. 12 mm irregular nodule in the right upper lobe was again noted. Faint nodule in the left lower lobe is noted peripherally. Heart size and pulmonary vascularity are normal. IMPRESSION: Increasing faint infiltrate in the left lung base superimposed on chronic lung disease. Pulmonary nodules are again noted. These could be inflammatory or metastatic in origin. Electronically Signed   By: Lorriane Shire M.D.   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  Discussed at length with pt and his fiance. Overall poor prognosis.  He certainly wants to "get better" but states he would NOT want to "go on life support" and that if it is his "time" he would want to be made comfortable.  He does feel better subjectively on bipap but still has significant respiratory distress and WOB.  Discussed with Dr. Conley Canal from IMTS.    PLAN -  -DNR/DNI  -PRN morphine - 1st dose now and increase dose as needed - may ultimately need gtt  -Continue empiric abx  -Continue attempts at diuresis as BP tolerates  -Continue BD's  -Pulmonary hygiene as able  -Would recommend palliative care input  -f/u CBC and transfuse for hgb <7 -If respiratory status stabilizes could consider paracentesis which may also help with respiratory status but I highly doubt he will reach a point where he could tolerate this  Nickolas Madrid, NP 01/19/2016  4:19 PM Pager: [phone] or  [phone]  ATTENDING NOTE / ATTESTATION NOTE :   I have discussed the case with the resident/APP  Nickolas Madrid NP as well as Dr. Benjamine Mola.   I agree with the resident/APP's  history, physical examination, assessment, and plans.    I have edited the above note and modified it according to our agreed history, physical examination, assessment and plan.   Briefly, pt with etoh abuse (quit 1 month ago), comes in with gend weakness and was found to be anemic.  CT scan of abd showed cirrhosis, ascites, likely liver/pancreatic mass, with liver mets and abd adenopathy.  Pt with worse resp status last 24 hrs.  Placed on BipaP and PCCM consulted.   Pt seen, in mild resp distress. Wants BiPaP off. Cachectic. Chronically ill. Tachycardic, tachypneic.  (-) NVD. Good ae. CTA. (+) distended abd, (-) masses/tenderness. (-) edema.   Labs reviewed. ABG acceptable on BipaP.  Ct scan of abd reviewed.  (-) significant lung dse seen.   PCCM discussed code  status with the pt and girlfriend.  Pt wishes to be DNR/DNI which is appropriate.  Will d/c bipap as it makes him uncomfortable. Keep on [place] to keep o2 sats > 88%. May try high flow if hypoxemic. Suggest morphine prn for pain/discomfort.   Suggest reevaluate code status in next 12-24 hrs.  If he remains tachypneic, suggest palliative care consult. Suggest transitioning to comfort care.  May need more frequent morphine.   PCCM will sign off for now. Pls call back if with issues.   Family :Family updated at length today.  Discussed plan with pt and girlfriend.    Monica Becton, MD 01/19/2016, 4:56 PM [facility] Pager [phone] After 3 pm or if no answer, call [phone]

## 2016-01-19 NOTE — Progress Notes (Signed)
eLink Physician-Brief Progress Note Patient Name: Scott Mahoney DOB: December 17, 1960 MRN: [id number]   Date of Service  01/19/2016  HPI/Events of Note  55 yo with ETOH abuse, liver masses, with elevated WBC count and elavated LA 3.6-->3.8-->4.4, ?SBP  eICU Interventions  Will give 1 liter NS bolus and assess LA in 2 hrs     Intervention Category Evaluation Type: New Patient Evaluation  Liandra Mendia 01/19/2016, 12:23 AM

## 2016-01-19 NOTE — Progress Notes (Signed)
BiPAP is on standby. Pt is without any distress on [place]. RT will continue to monitor pt and will placed if needed or requested.

## 2016-01-20 DIAGNOSIS — Z515 Encounter for palliative care: Secondary | ICD-10-CM

## 2016-01-20 DIAGNOSIS — Z9889 Other specified postprocedural states: Secondary | ICD-10-CM

## 2016-01-20 DIAGNOSIS — F1020 Alcohol dependence, uncomplicated: Secondary | ICD-10-CM

## 2016-01-20 DIAGNOSIS — D72829 Elevated white blood cell count, unspecified: Secondary | ICD-10-CM

## 2016-01-20 DIAGNOSIS — R740 Nonspecific elevation of levels of transaminase and lactic acid dehydrogenase [LDH]: Secondary | ICD-10-CM

## 2016-01-20 DIAGNOSIS — Z66 Do not resuscitate: Secondary | ICD-10-CM

## 2016-01-20 DIAGNOSIS — R509 Fever, unspecified: Secondary | ICD-10-CM

## 2016-01-20 LAB — CBC
HCT: 23.2 % — ABNORMAL LOW (ref 39.0–52.0)
HCT: 26.8 % — ABNORMAL LOW (ref 39.0–52.0)
HEMOGLOBIN: 9 g/dL — AB (ref 13.0–17.0)
Hemoglobin: 7.7 g/dL — ABNORMAL LOW (ref 13.0–17.0)
MCH: 28.3 pg (ref 26.0–34.0)
MCH: 28.5 pg (ref 26.0–34.0)
MCHC: 33.2 g/dL (ref 30.0–36.0)
MCHC: 33.6 g/dL (ref 30.0–36.0)
MCV: 85.3 fL (ref 78.0–100.0)
MCV: 84.8 fL (ref 78.0–100.0)
PLATELETS: 467 10*3/uL — AB (ref 150–400)
PLATELETS: 443 10*3/uL — AB (ref 150–400)
RBC: 2.72 MIL/uL — ABNORMAL LOW (ref 4.22–5.81)
RBC: 3.16 MIL/uL — ABNORMAL LOW (ref 4.22–5.81)
RDW: 17.9 % — AB (ref 11.5–15.5)
RDW: 17.7 % — ABNORMAL HIGH (ref 11.5–15.5)
WBC: 40.7 10*3/uL — AB (ref 4.0–10.5)
WBC: 37.9 10*3/uL — ABNORMAL HIGH (ref 4.0–10.5)

## 2016-01-20 LAB — BASIC METABOLIC PANEL
ANION GAP: 13 (ref 5–15)
BUN: 17 mg/dL (ref 6–20)
CALCIUM: 8.4 mg/dL — AB (ref 8.9–10.3)
CO2: 17 mmol/L — ABNORMAL LOW (ref 22–32)
Chloride: 100 mmol/L — ABNORMAL LOW (ref 101–111)
Creatinine, Ser: 0.74 mg/dL (ref 0.61–1.24)
GLUCOSE: 115 mg/dL — AB (ref 65–99)
Potassium: 4.6 mmol/L (ref 3.5–5.1)
Sodium: 130 mmol/L — ABNORMAL LOW (ref 135–145)

## 2016-01-20 LAB — MRSA PCR SCREENING: MRSA BY PCR: NEGATIVE

## 2016-01-20 LAB — HEPATITIS C ANTIBODY: HCV Ab: 0.1 s/co ratio (ref 0.0–0.9)

## 2016-01-20 MED ORDER — SENNA 8.6 MG PO TABS
2.0000 | ORAL_TABLET | Freq: Every day | ORAL | Status: DC
  Administered 2016-01-20: 17.2 mg via ORAL
  Filled 2016-01-20 (×2): qty 2

## 2016-01-20 MED ORDER — ORAL CARE MOUTH RINSE
15.0000 mL | Freq: Two times a day (BID) | OROMUCOSAL | Status: DC
  Administered 2016-01-20 – 2016-01-22 (×4): 15 mL via OROMUCOSAL

## 2016-01-20 MED ORDER — LORAZEPAM 2 MG/ML IJ SOLN
0.5000 mg | INTRAMUSCULAR | Status: DC | PRN
  Administered 2016-01-20: 0.5 mg via INTRAVENOUS
  Filled 2016-01-20: qty 1

## 2016-01-20 MED ORDER — POLYETHYLENE GLYCOL 3350 17 G PO PACK
17.0000 g | PACK | Freq: Every day | ORAL | Status: DC | PRN
  Administered 2016-01-20: 17 g via ORAL
  Filled 2016-01-20 (×2): qty 1

## 2016-01-20 MED ORDER — LEVOFLOXACIN 750 MG PO TABS
750.0000 mg | ORAL_TABLET | Freq: Every day | ORAL | Status: DC
  Administered 2016-01-20: 750 mg via ORAL
  Filled 2016-01-20 (×2): qty 1

## 2016-01-20 NOTE — Consult Note (Signed)
Consultation Note Date: 01/20/2016   Patient Name: Scott Mahoney  DOB: Jan 31, 1961  MRN: [id number]  Age / Sex: 55 y.o., male  PCP: Rogers Blocker, MD Referring Physician: Axel Filler, MD  Reason for Consultation: Establishing goals of care, Hospice Evaluation, Non pain symptom management, Pain control and Psychosocial/spiritual support  HPI/Patient Profile: 55 y.o. male  with past medical history of Asthma, COPD, pancreatitis, alcohol abuse, gastric ulcers, admitted on 01/16/2016 with increasing shortness of breath, 50 pound weight loss happening over the past 6 months. Upon presentation his hemoglobin was 4, temperature 101.2 and he was tachycardic. CT scan performed on admission showed multiple hepatic lesions as well as mild widespread metastatic disease including bilateral pulmonary nodules and spinal stenosis as well as mesenteric adenopathy. It is unknown at this point whether liver cancer is the primary versus pancreas. Patient got acutely short of breath last night and was placed on BiPAP which he verbalized did help. Clinical Assessment and Goals of Care:  He is very weak but able to speak for himself. He is oriented 3. He appears short of breath at rest. He tells me he knows he's dying and that he is not afraid. He is concerned that he will be put on life support and I assured him that in previous conversations with Dr. Corrie Dandy that had been changed to DO NOT RESUSCITATE. He is undecided about other options going forward because he doesn't believe that his fiance understands how ill he is and he wishes to discuss this with her first. We discussed the goals of a liver biopsy in terms of guiding chemotherapy and treatment options as well as confirming the diagnosis. He himself does not seem unsure of the diagnosis considering how sick he has been he states.  Patient at this point can still make  decisions for himself. He states he is unmarried, no biological children. He does have numerous siblings who in the absence of specific healthcare power of attorney paperwork would be his proxy decision maker. It sounds as though he would want his fiance of 10 years to be in this capacity and thus would require healthcare power of attorney paperwork to be completed. He wishes to speak to his fiance before he makes any final decisions about proceeding with a liver biopsy, meeting with an oncologist or even considering chemotherapy if it were to be offered    Pray   DNR/DNI Continue to treat the treatable He likely would try BiPAP again in the setting of acute dyspnea but would recommend against high flow O2 because of the difficulty getting off of this delivery system. Both of these devices however if he does elect a comfort care approach and to go home would be an impediment to this. I am trying to reach his fiance. Her cell number does not leave the option for a message. We will continue to follow up on the floor, and have nurses call when she does arrive Code Status/Advance Care Planning:  DNR    Symptom  Management:   Dyspnea: Continue with targeted pulmonary treatments such as nebulizer treatments and O2 via nasal cannula or even 100% nonrebreather. Primary management will it's likely to be the most effective would be opioids in this case he has been taking morphine with relief of both dyspnea as well as pain. Continue this at 2-4 mg every 1 hour as needed. We'll monitor for need for scheduled dosing and for a continuous infusion. Would recommend trying opioids as first line versus BiPAP and certainly high flow O2. Patient may also benefit from some steroids for both respiratory comfort as well as managing bone pain. Would recommend Decadron 4 mg  Pain: Continue with as needed IV morphine  Constipation: We'll schedule senna 2 tabs daily and continue with MiraLAX daily  as needed. Patient is taking opioids on a daily basis and thus will require a scheduled laxative.  Anxiety: We'll add Ativan 0.5-1 mg IV every 4 as needed  Palliative Prophylaxis:   Aspiration, Bowel Regimen, Delirium Protocol, Eye Care, Frequent Pain Assessment, Oral Care and Turn Reposition  Additional Recommendations (Limitations, Scope, Preferences):  No Artificial Feeding and No Tracheostomy  Psycho-social/Spiritual:   Desire for further Chaplaincy support:no  Additional Recommendations: Grief/Bereavement Support  Prognosis:   < 4 weeks. Patient acknowledges that he is dying however I'm not sure that he recognizes the gravity of his illness in terms of he could potentially have a prognosis of less than 4 weeks. At this point I did not share this with him he has become quite sleepy after asking for morphine to help him breathe. He did share with me that he wishes to talk over with his fiance issue such as biopsy outpatient chemotherapy.  Discharge Planning: Patient appears extremely ill to me with advanced metastatic cancer of either liver primary or pancreatic primary. It's difficult to see how he could tolerate chemotherapy at this point. He would qualify for either hospice in the home or hospice inpatient in my opinion. I did not share this with patient. I am hoping to speak with his fiance then proceed with advance care planning/HCPOA      Primary Diagnoses: Present on Admission: . Anemia   I have reviewed the medical record, interviewed the patient and family, and examined the patient. The following aspects are pertinent.  Past Medical History:  Diagnosis Date  . Asthma   . Black lung ([place])   . COPD (chronic obstructive pulmonary disease) ([place])   . Multiple gastric ulcers   . Pancreatitis    Social History   Social History  . Marital status: Single    Spouse name: N/A  . Number of children: N/A  . Years of education: N/A   Social History Main Topics  .  Smoking status: Current Every Day Smoker    Packs/day: 0.25    Types: Cigarettes  . Smokeless tobacco: Never Used  . Alcohol use Yes     Comment: 1 pt of liquor   . Drug use:     Types: Marijuana  . Sexual activity: Yes   Other Topics Concern  . None   Social History Narrative  . None   No family history on file. Scheduled Meds: . enoxaparin (LOVENOX) injection  40 mg Subcutaneous Q24H  . ipratropium-albuterol  3 mL Nebulization Q6H  . levofloxacin  750 mg Oral Daily  . mouth rinse  15 mL Mouth Rinse BID  . senna  2 tablet Oral QHS  . sodium chloride flush  3 mL Intravenous Q12H  Continuous Infusions: PRN Meds:.albuterol, LORazepam, morphine injection, polyethylene glycol Medications Prior to Admission:  Prior to Admission medications   Medication Sig Start Date End Date Taking? Authorizing Provider  omeprazole (PRILOSEC) 40 MG capsule Take 1 capsule (40 mg total) by mouth daily. Reported on 07/17/2015 07/18/15  Yes Jessica L Focht, PA  ranitidine (ZANTAC) 150 MG tablet Take 150 mg by mouth at bedtime.  06/26/15  Yes Historical Provider, MD  cetirizine-pseudoephedrine (ZYRTEC-D) 5-120 MG tablet Take 1 tablet by mouth daily. Patient not taking: Reported on 01/12/2016 07/18/15   Jessica L Focht, PA  COMBIVENT RESPIMAT 20-100 MCG/ACT AERS respimat Inhale 1 puff into the lungs every 6 (six) hours as needed for wheezing or shortness of breath.  06/26/15   Historical Provider, MD  predniSONE (DELTASONE) 20 MG tablet Take 3 tablets (60 mg total) by mouth daily. Patient not taking: Reported on 01/02/2016 07/18/15   Fraser Din Focht, PA  PROVENTIL HFA 108 (90 Base) MCG/ACT inhaler Inhale 2 puffs into the lungs every 6 (six) hours as needed for wheezing or shortness of breath.  06/26/15   Historical Provider, MD  QVAR 80 MCG/ACT inhaler Inhale 2 puffs into the lungs 2 (two) times daily. 06/26/15   Historical Provider, MD   Allergies  Allergen Reactions  . Penicillins Anaphylaxis  . Asa  [Aspirin] Other (See Comments)    stomach pains and severe discomfort   Review of Systems  Unable to perform ROS: Other    Physical Exam  Constitutional:  Cachetic frail weak older man  HENT:  Head: Normocephalic and atraumatic.  Eyes: Scleral icterus is present.  Cardiovascular:  Irrg, tachy  Pulmonary/Chest:  Increased work of breathing  Abdominal: He exhibits distension.  ascites  Neurological:  somnolent but oriented x 3  Skin: Skin is warm and dry.  Psychiatric:  Affect constricted  Nursing note and vitals reviewed.   Vital Signs: BP 104/74   Pulse (!) 131   Temp 97.9 F (36.6 C)   Resp (!) 24   Ht 5\' 7"  (1.702 m)   Wt 54.1 kg (119 lb 4.3 oz)   SpO2 98%   BMI 18.68 kg/m  Pain Assessment: 0-10 POSS *See Group Information*: 1-Acceptable,Awake and alert Pain Score: 1    SpO2: SpO2: 98 % O2 Device:SpO2: 98 % O2 Flow Rate: .O2 Flow Rate (L/min): 0.5 L/min  IO: Intake/output summary:  Intake/Output Summary (Last 24 hours) at 01/20/16 1607 Last data filed at 01/20/16 0900  Gross per 24 hour  Intake                0 ml  Output              475 ml  Net             -475 ml    LBM: Last BM Date: 01/19/2016 Baseline Weight: Weight: 54.4 kg (120 lb) Most recent weight: Weight: 54.1 kg (119 lb 4.3 oz)     Palliative Assessment/Data:   Flowsheet Rows   Flowsheet Row Most Recent Value  Intake Tab  Date Notified  01/19/16  Palliative Care Type  New Palliative care  Reason for referral  Pain, Non-pain Symptom, Clarify Goals of Care, Psychosocial or Spiritual support, Counsel Regarding Hospice  Date of Admission  01/05/2016  Date first seen by Palliative Care  01/20/16  # of days Palliative referral response time  1 Day(s)  # of days IP prior to Palliative referral  2  Clinical Assessment  Palliative Performance Scale  Score  30%  Pain Max last 24 hours  Not able to report  Pain Min Last 24 hours  Not able to report  Dyspnea Max Last 24 Hours  Not able to  report  Dyspnea Min Last 24 hours  Not able to report  Nausea Max Last 24 Hours  Not able to report  Nausea Min Last 24 Hours  Not able to report  Anxiety Max Last 24 Hours  Not able to report  Anxiety Min Last 24 Hours  Not able to report  Other Max Last 24 Hours  Not able to report  Psychosocial & Spiritual Assessment  Palliative Care Outcomes  Patient/Family meeting held?  Yes  Who was at the meeting?  pt but attempting to reach fiance  Palliative Care Outcomes  Improved non-pain symptom therapy  Patient/Family wishes: Interventions discontinued/not started   Mechanical Ventilation, Trach, PEG  Palliative Care follow-up planned  Yes, Facility      Time In: 1500 Time Out: 1620 Time Total: 80 min Greater than 50%  of this time was spent counseling and coordinating care related to the above assessment and plan.  Signed by: Dory Horn, NP   Please contact Palliative Medicine Team phone at [phone] for questions and concerns.  For individual provider: See Shea Evans

## 2016-01-20 NOTE — Progress Notes (Signed)
Subjective: Dyspnea improved compared to yesterday, no chest pain.  Would like some ginger ale to drink.  He would like to be able to leave the hospital and go home.  With acutely worsening hypoxic respiratory failure yesterday, PCCM was consulted. After extensive discussion, he was made DNR/DNI.  Oxygenation was supported with BiPAP, but later discontinued according to patient preference.  Understands Palliative care will visit to further discuss options for medical care going forward.    Objective:  Vital signs in last 24 hours: Vitals:   01/20/16 0300 01/20/16 0400 01/20/16 0725 01/20/16 0933  BP: 101/71 (!) 84/59 90/63   Pulse: (!) 114 (!) 117 (!) 118   Resp: 16 17 (!) 21   Temp: 98.8 F (37.1 C)     TempSrc: Oral     SpO2: 100% 100% 100% 100%  Weight:      Height:       Physical Exam  Constitutional: He is oriented to person, place, and time.  Thin, chronically ill appearing, mildly diaphoretic, eating breakfast in no acute distress  Eyes: Scleral icterus is present.  Cardiovascular: Regular rhythm and normal heart sounds.   Tachycardic  Pulmonary/Chest: Effort normal. He has no wheezes.  Mild bisbasilar crackles  Abdominal:  Belly tense RUQ tenderness to palpation Hepatomegaly  Musculoskeletal: He exhibits no edema or tenderness.  Neurological: He is alert and oriented to person, place, and time.  Skin: No pallor.  Psychiatric: He has a normal mood and affect. His behavior is normal.   CBC Latest Ref Rng & Units 01/19/2016 01/19/2016 01/18/2016  WBC 4.0 - 10.5 K/uL 38.6(H) 35.6(H) 39.3(H)  Hemoglobin 13.0 - 17.0 g/dL 11.1(L) 9.0(L) 10.6(L)  Hematocrit 39.0 - 52.0 % 32.6(L) 26.4(L) 30.1(L)  Platelets 150 - 400 K/uL 466(H) 400 494(H)   BMP Latest Ref Rng & Units 01/19/2016 01/19/2016 01/18/2016  Glucose 65 - 99 mg/dL 111(H) 111(H) 128(H)  BUN 6 - 20 mg/dL 11 10 10   Creatinine 0.61 - 1.24 mg/dL 0.71 0.58(L) 0.66  Sodium 135 - 145 mmol/L 130(L) 130(L) 127(L)    Potassium 3.5 - 5.1 mmol/L 4.5 4.6 4.3  Chloride 101 - 111 mmol/L 99(L) 101 96(L)  CO2 22 - 32 mmol/L 17(L) 20(L) 19(L)  Calcium 8.9 - 10.3 mg/dL 8.2(L) 7.8(L) 8.1(L)   Peripheral blood smear 01/24/2016 Reviewed with hematologist Dr Beryle Beams.  Abundant mature neutrophils with some toxic granulations.  No blasts, rare mature lymphs.  Urine culture 01/05/2016 No growth final  Blood cultures 01/15/2016 No growth 2 days  Blood cultures 01/18/2016 No growth 1 day  Assessment/Plan:  Principal Problem:   Symptomatic anemia Active Problems:   Liver masses   Lung nodules   Anemia   Acute hypoxemic respiratory failure ([place])  55 year old man with history of severe alcohol abuse who presents with anemia, leukocytosis, and thrombocythemia and has numerous new lung and liver masses concerning for metastatic disease.  Acute hypoxemic respiratory failure on [id number] of uncertain etiology, now somewhat improved.  #Dyspnea Acutely worsening dyspnea on 11/23.  Nebulizer treatments initially provided transient relief, but he became progressively more tachypneic and tachycardic over the course of the day.  ABG showed mild hypoxia and respiratory alkalosis.  With crackles and hypoxia, diuresis was attempted with 40mg  IV lasix once without improvement.  He was placed on BiPAP and oxygenated and ventilated well.  However, BiPAP was subsequently removed due to patient preference.  PE is a concern with his malignancy, steep A-a gradient, and new hypoxic respiratory failure, despite  negative PE CTA on admission and prophylactic lovenox. -Q6H DuoNebs -BiPAP PRN -Palliative care will see today -Morphine PRN for dyspnea and pain -Consider large volume paracentesis for symptomatic relief if dyspnea worsens  #Infection? Febrile with marked leukocytosis, but most likely does not have bacterial infection.  No diarrhea to suggest C Diff, UA non-infected and urine culture without growth, no consolidation on CXR.   Cholecystitis with gallbladder wall thickening shown on [place].  Blood cultures no growth 2 days.  Ascites fluid cell counts not diagnostic of SBP, ascites culture no growth 1 day.  His elevated lactic acid is most likely from necrotizing tumors with reactive leukocytosis from metastatic disease. -Continue empiric levofloxacin for 5 day course, transition to PO (end tomorrow 11/25) -IVF to support hemodynamics as needed -F/u blood cultures, ascites culture  #Symptomatic Anemia Received 2U pRBCs with greater than expected increase in Hgb, held steady after transfusion.  Initial concern for hemolysis with elevated bili and LDH, but DAT negative, high haptoglobin, Hgb stable, does not seem to be actively hemolyzing. -Monitor daily CBC -Transfuse as needed fpr Hgb >7.0  #Liver Masses #Lung Masses Likely metastatic disease with possible HCC or pancreatic primary. -Will discuss percutaneous biopsy with patient -F/u ascites cytology -Palliative care consult -Inpatient oncology consult vs outpatient referral  Dispo: Anticipated discharge in approximately 3 day(s).   Minus Liberty, MD 01/20/2016, 9:37 AM Pager: [phone]

## 2016-01-20 NOTE — Progress Notes (Signed)
Notified Internal Medicine of Critical Lab of HB of 7.7

## 2016-01-21 DIAGNOSIS — R0600 Dyspnea, unspecified: Secondary | ICD-10-CM

## 2016-01-21 DIAGNOSIS — R1901 Right upper quadrant abdominal swelling, mass and lump: Secondary | ICD-10-CM

## 2016-01-21 LAB — TYPE AND SCREEN
ABO/RH(D): B POS
Antibody Screen: NEGATIVE
UNIT DIVISION: 0
UNIT DIVISION: 0
UNIT DIVISION: 0
Unit division: 0

## 2016-01-21 LAB — BODY FLUID CULTURE: CULTURE: NO GROWTH

## 2016-01-21 LAB — CBC
HEMATOCRIT: 23.4 % — AB (ref 39.0–52.0)
Hemoglobin: 7.7 g/dL — ABNORMAL LOW (ref 13.0–17.0)
MCH: 27.7 pg (ref 26.0–34.0)
MCHC: 32.9 g/dL (ref 30.0–36.0)
MCV: 84.2 fL (ref 78.0–100.0)
Platelets: 409 10*3/uL — ABNORMAL HIGH (ref 150–400)
RBC: 2.78 MIL/uL — ABNORMAL LOW (ref 4.22–5.81)
RDW: 18 % — AB (ref 11.5–15.5)
WBC: 34.9 10*3/uL — ABNORMAL HIGH (ref 4.0–10.5)

## 2016-01-21 MED ORDER — LEVOFLOXACIN 750 MG PO TABS
750.0000 mg | ORAL_TABLET | Freq: Every day | ORAL | Status: DC
  Filled 2016-01-21: qty 1

## 2016-01-21 MED ORDER — DEXTROSE-NACL 5-0.9 % IV SOLN
INTRAVENOUS | Status: DC
  Administered 2016-01-21: 11:00:00 via INTRAVENOUS
  Filled 2016-01-21 (×2): qty 1000

## 2016-01-21 MED ORDER — SODIUM CHLORIDE 0.9 % IV SOLN
2.0000 mg/h | INTRAVENOUS | Status: DC
  Administered 2016-01-21: 1 mg/h via INTRAVENOUS
  Filled 2016-01-21: qty 10

## 2016-01-21 MED ORDER — ENSURE ENLIVE PO LIQD
237.0000 mL | Freq: Three times a day (TID) | ORAL | Status: DC
  Administered 2016-01-21 (×2): 237 mL via ORAL

## 2016-01-21 MED ORDER — GLYCOPYRROLATE 0.2 MG/ML IJ SOLN
0.4000 mg | INTRAMUSCULAR | Status: DC | PRN
  Administered 2016-01-23: 0.4 mg via INTRAVENOUS
  Filled 2016-01-21: qty 2

## 2016-01-21 MED ORDER — IPRATROPIUM-ALBUTEROL 0.5-2.5 (3) MG/3ML IN SOLN
3.0000 mL | RESPIRATORY_TRACT | Status: DC | PRN
Start: 2016-01-21 — End: 2016-01-23

## 2016-01-21 MED ORDER — IPRATROPIUM-ALBUTEROL 0.5-2.5 (3) MG/3ML IN SOLN: 3.0000 mL | Freq: Two times a day (BID) | RESPIRATORY_TRACT | Status: DC

## 2016-01-21 NOTE — Progress Notes (Signed)
Pt. Transferred to 6N. Report given, partner went up with him. Got brother's voicemail when calling to update on transfer.

## 2016-01-21 NOTE — Progress Notes (Signed)
                                                                                                                                                                                                         Daily Progress Note   Patient Name: Scott Mahoney       Date: 01/21/2016 DOB: 04/11/1960  Age: 54 y.o. MRN#: [id number] Attending Physician: Duncan Thomas Vincent, MD Primary Care Physician: Eric L Dean, MD Admit Date: 01/09/2016  Reason for Consultation/Follow-up: Establishing goals of care, Hospice Evaluation, Non pain symptom management, Pain control, Psychosocial/spiritual support and Terminal Care  Subjective: Pt declining quickly. I was unable to reach his fiance but his brothers are arriving to the unit. Per 3rd shift RN pt told her that he wanted his brother [name] to make his decisions. He was able to shake his head yes that he wanted his brother [name] to speak for him vs his fiance. His speech is so soft it is difficult to understand him. I attempted to discuss transfer out of ICU, residential hospice.  Brother [name], [name], [name], [name], niece [name], and fiance [name] present for discussion  Length of Stay: 4  Current Medications: Scheduled Meds:  . enoxaparin (LOVENOX) injection  40 mg Subcutaneous Q24H  . feeding supplement (ENSURE ENLIVE)  237 mL Oral TID BM  . levofloxacin  750 mg Oral Daily  . mouth rinse  15 mL Mouth Rinse BID  . senna  2 tablet Oral QHS  . sodium chloride flush  3 mL Intravenous Q12H    Continuous Infusions: . dextrose 5 % and 0.9% NaCl 1,000 mL infusion    . morphine      PRN Meds: glycopyrrolate, ipratropium-albuterol, LORazepam, morphine injection, polyethylene glycol  Physical Exam  Constitutional:  Cachetic acutely ill appearing older man  HENT:  Head: Normocephalic and atraumatic.  Pulmonary/Chest:  Increased work of breathing at rest  Abdominal:  ascites  Genitourinary:  Genitourinary Comments: foley  Neurological:    Somnolent. Hypophonia, unable to understand his speech  Skin: Skin is warm and dry.  Psychiatric:  withdrawn  Nursing note and vitals reviewed.           Vital Signs: BP 92/71 (BP Location: Left Arm)   Pulse (!) 124   Temp 99.4 F (37.4 C) (Axillary)   Resp (!) 28   Ht 5' 7" (1.702 m)   Wt 54.1 kg (119 lb 4.3 oz)   SpO2 97%   BMI 18.68 kg/m  SpO2: SpO2: 97 % O2 Device: O2 Device: Not Delivered O2   Flow Rate: O2 Flow Rate (L/min): 0.5 L/min  Intake/output summary:  Intake/Output Summary (Last 24 hours) at 01/21/16 0954 Last data filed at 01/21/16 0900  Gross per 24 hour  Intake              740 ml  Output                0 ml  Net              740 ml   LBM: Last BM Date: 01/20/16 Baseline Weight: Weight: 54.4 kg (120 lb) Most recent weight: Weight: 54.1 kg (119 lb 4.3 oz)       Palliative Assessment/Data:    Flowsheet Rows   Flowsheet Row Most Recent Value  Intake Tab  Date Notified  01/19/16  Palliative Care Type  New Palliative care  Reason for referral  Pain, Non-pain Symptom, Clarify Goals of Care, Psychosocial or Spiritual support, Counsel Regarding Hospice  Date of Admission  01/09/2016  Date first seen by Palliative Care  01/20/16  # of days Palliative referral response time  1 Day(s)  # of days IP prior to Palliative referral  2  Clinical Assessment  Palliative Performance Scale Score  30%  Pain Max last 24 hours  Not able to report  Pain Min Last 24 hours  Not able to report  Dyspnea Max Last 24 Hours  Not able to report  Dyspnea Min Last 24 hours  Not able to report  Nausea Max Last 24 Hours  Not able to report  Nausea Min Last 24 Hours  Not able to report  Anxiety Max Last 24 Hours  Not able to report  Anxiety Min Last 24 Hours  Not able to report  Other Max Last 24 Hours  Not able to report  Psychosocial & Spiritual Assessment  Palliative Care Outcomes  Patient/Family meeting held?  Yes  Who was at the meeting?  pt but attempting to reach fiance   Palliative Care Outcomes  Improved non-pain symptom therapy  Patient/Family wishes: Interventions discontinued/not started   Mechanical Ventilation, Trach, PEG  Palliative Care follow-up planned  Yes, Facility      Patient Active Problem List   Diagnosis Date Noted  . Palliative care encounter   . Acute hypoxemic respiratory failure ([place])   . Symptomatic anemia 01/24/2016  . Liver masses 01/10/2016  . Lung nodules 01/06/2016  . Anemia 01/08/2016    Palliative Care Assessment & Plan   Patient Profile: 55yo male with hx asthma, COPD, pancreatitis, ETOH abuse presented 11/21 with increasing SOB, cough, 50lbs weight loss. Found to have severe anemia with hgb 4, tempt 101.2, tachycardia.  He was admitted by IMTS and abd u/s revealed multiple hepatic masses.  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   Pt has had progressive respiratory failure despite empiric abx, correction of anemia and attempts at diuresis and on 11/23 pt required bipap for increased WOB.  PCCM consulted.    Assessment: Pt is still alert, but struggling to communicate his needs. He appears visibly SHOB at rest. Abd distended. Hypophonia present  Recommendations/Plan: Met with 4 of 7 siblings as well as fiance and all are in agreement with the following:   It is my impression that given his rapid decline, pt is not a bx candidate, nor a candidate for palliative chemo/XRT. Family memebers in agreement; no BX, no chemo, XRT. Pt too sick  for further aggressive interventions  Focus on symptom mgt. In agreement with MS04 continuous infusion for dyspnea and pain. Would like to remain on abx for as long as he can swallow . Family concern that unmanaged infection could cause distress  [facility] at [facility] understands he is a candidate for residential hospice but feels he is too sick to move  Goals of  Care and Additional Recommendations:  Limitations on Scope of Treatment: Initiate Comfort Feeding, No Artificial Feeding, No Hemodialysis, No Radiation, No Surgical Procedures and No Tracheostomy  Code Status:    Code Status Orders        Start     Ordered   01/19/16 1805  Do not attempt resuscitation (DNR)  Continuous    Question Answer Comment  In the event of cardiac or respiratory ARREST Do not call a "code blue"   In the event of cardiac or respiratory ARREST Do not perform Intubation, CPR, defibrillation or ACLS   In the event of cardiac or respiratory ARREST Use medication by any route, position, wound care, and other measures to relive pain and suffering. May use oxygen, suction and manual treatment of airway obstruction as needed for comfort.      01/19/16 1807    Code Status History    Date Active Date Inactive Code Status Order ID Comments User Context   01/20/2016  8:37 PM 01/19/2016  6:07 PM Full Code [id number]  Collier Salina, MD Inpatient       Prognosis:   Hours - Days  Discharge Planning:  Anticipated Hospital Death. Informed family that he would meet criteria for residential hospice but family concerned he is too sick to move.  Care plan was discussed with Dr. Inda Castle  Thank you for allowing the Palliative Medicine Team to assist in the care of this patient.   Time In: 0900 1100 Time Out: 1000 1135 Total Time 95 min Prolonged Time Billed  yes       Greater than 50%  of this time was spent counseling and coordinating care related to the above assessment and plan.  Dory Horn, NP  Please contact Palliative Medicine Team phone at [phone] for questions and concerns.

## 2016-01-21 NOTE — Progress Notes (Signed)
Subjective: He has become more difficult to communicate with due to increasing lethargy and hypophonia.  He denies pain and dyspnea.  Talked about how the hospital may no longer be the best place for him to spend his time, and that possible options include hospice and home hospice.  Yesterday there was an unexplained drop in his Hgb 11.1 -> 7.7, 9.0 repeated a few hours later.  No clinical bleeding, abdominal exam unchanged.  His oral intake has fallen off.  Objective:  Vital signs in last 24 hours: Vitals:   01/20/16 2300 01/20/16 2346 01/21/16 0453 01/21/16 0749  BP: 90/64 95/76 92/71    Pulse: (!) 124 (!) 120 (!) 127 (!) 124  Resp: 20 (!) 27 (!) 28   Temp:  97.4 F (36.3 C) 97.2 F (36.2 C) 99.4 F (37.4 C)  TempSrc:  Oral Oral Axillary  SpO2: 100% 100% 99% 97%  Weight:      Height:       Physical Exam  Constitutional: He is oriented to person, place, and time.  Thin, chronically ill appearing, mildly diaphoretic, eating breakfast in no acute distress  Eyes: Scleral icterus is present.  Cardiovascular: Regular rhythm and normal heart sounds.   Tachycardic  Pulmonary/Chest: Effort normal. He has no wheezes.  Abdominal:  Belly tense RUQ tenderness to palpation Hepatomegaly  Musculoskeletal: He exhibits no edema or tenderness.  Neurological: He is alert and oriented to person, place, and time.  Skin: No pallor.  Psychiatric: He has a normal mood and affect. His behavior is normal.   CBC Latest Ref Rng & Units 01/20/2016 01/20/2016 01/19/2016  WBC 4.0 - 10.5 K/uL 37.9(H) 40.7(H) 38.6(H)  Hemoglobin 13.0 - 17.0 g/dL 9.0(L) 7.7(L) 11.1(L)  Hematocrit 39.0 - 52.0 % 26.8(L) 23.2(L) 32.6(L)  Platelets 150 - 400 K/uL 443(H) 467(H) 466(H)   BMP Latest Ref Rng & Units 01/20/2016 01/19/2016 01/19/2016  Glucose 65 - 99 mg/dL 115(H) 111(H) 111(H)  BUN 6 - 20 mg/dL 17 11 10   Creatinine 0.61 - 1.24 mg/dL 0.74 0.71 0.58(L)  Sodium 135 - 145 mmol/L 130(L) 130(L) 130(L)  Potassium  3.5 - 5.1 mmol/L 4.6 4.5 4.6  Chloride 101 - 111 mmol/L 100(L) 99(L) 101  CO2 22 - 32 mmol/L 17(L) 17(L) 20(L)  Calcium 8.9 - 10.3 mg/dL 8.4(L) 8.2(L) 7.8(L)   Peripheral blood smear 01/07/2016 Reviewed with hematologist Dr Beryle Beams.  Abundant mature neutrophils with some toxic granulations.  No blasts, rare mature lymphs.  Urine culture 01/09/2016 No growth final  Blood cultures 01/16/2016 No growth 3 days  Blood cultures 01/18/2016 No growth 2 days  Ascites culture 01/18/2016 No growth 2 days  Assessment/Plan:  Principal Problem:   Symptomatic anemia Active Problems:   Liver masses   Lung nodules   Anemia   Acute hypoxemic respiratory failure (HCC)   Palliative care encounter  55 year old man with history of severe alcohol abuse who presents with anemia, leukocytosis, and thrombocythemia and has numerous new lung and liver masses concerning for metastatic disease.  Acute hypoxemic respiratory failure on [id number] of uncertain etiology, now stabilized.  He has limited life expectancy, and currently working to clarify goals of care and disposition with help of Palliative Care.   #Dyspnea Acutely worsening dyspnea on 11/23, now stabilized and on room air.  Nebulizer treatments initially provided transient relief, but he became progressively more tachypneic and tachycardic over the course of the day.  ABG showed mild hypoxia and respiratory alkalosis.  With crackles and hypoxia, diuresis was attempted  with 40mg  IV lasix once without improvement.  He was placed on BiPAP and oxygenated and ventilated well.  However, BiPAP was subsequently removed due to patient preference. -Q6H DuoNebs -BiPAP PRN -Morphine PRN for dyspnea and pain -Consider large volume paracentesis for symptomatic relief if dyspnea worsens  #Infection? Intermittently febrile with marked leukocytosis, but most likely does not have bacterial infection.  No diarrhea to suggest C Diff, UA non-infected and urine  culture without growth, no consolidation on CXR.  Cholecystitis with gallbladder wall thickening shown on [place].  Blood cultures no growth 2 days.  Ascites fluid cell counts not diagnostic of SBP, ascites culture no growth 1 day.  His elevated lactic acid is most likely from necrotizing tumors with reactive leukocytosis from metastatic disease. -Complete 5 days empiric levofloxacin today -IVF to support hemodynamics as needed -F/u blood cultures, ascites culture  #Symptomatic Anemia Received 2U pRBCs with greater than expected increase in Hgb, held steady after transfusion.  Initial concern for hemolysis with elevated bili and LDH, but DAT negative, high haptoglobin, Hgb acutely dropped on 11/24 but inconsistent on repeat CBC and no clinical bleeding. -Monitor daily CBC -Transfuse as needed for Hgb >7.0  #Liver Masses #Lung Masses Likely metastatic disease with possible HCC or pancreatic primary.  It now seems unlikely that could tolerate or benefit from a liver biopsy or chemotherapy. -F/u ascites cytology  Dispo: Anticipated discharge in approximately 2 day(s).   Minus Liberty, MD 01/21/2016, 7:58 AM Pager: [phone]

## 2016-01-21 NOTE — Progress Notes (Signed)
Correction regarding previous note. Brothers name is Shafter Keefover not Junior. RN spoke with him this am on the phone and he said he went by the house last night and no body came to the door. Also states he's been trying to call her and hasn't got a response. Jeneen Rinks stated he wanted to give his significant other a chance to make the decisions but is ready to move on. States he is going to go into work and let them know he can't be there today and come in to get things moving forward to get his brother into a Hospice facility. Palliative care NP Dory Horn arrived just after hanging up the phone and was updated on the plan. She also took his other brother Lake Bells into the conference room to discuss patients status. Will call Chaplain and AC to find a notary for patient to sign appointing a [facility].

## 2016-01-21 NOTE — Progress Notes (Signed)
Spoke with patients brother who was at bedside regarding patients status per patients request. Brothers name is Junior Portman and his cell phone number is [phone]. Patient told his brother that if his girlfriend didn't come in tomorrow then he wanted Junior to become his HCPOA. Junior states he is one of 7 of the patients brothers and he has to work tomorrow from [employer] but can come in here if needed instead. Junior also stated he would stop by the patients house and look for the patient significant other and make sure she knows we are trying to contact her and that she needs to come in here in the morning or call.

## 2016-01-22 LAB — CULTURE, BLOOD (ROUTINE X 2)
Culture: NO GROWTH
Culture: NO GROWTH

## 2016-01-22 MED ORDER — BISACODYL 10 MG RE SUPP: 10.0000 mg | Freq: Every day | RECTAL | Status: DC | PRN

## 2016-01-22 MED ORDER — KETOROLAC TROMETHAMINE 30 MG/ML IJ SOLN: 30.0000 mg | Freq: Three times a day (TID) | INTRAMUSCULAR | Status: DC | PRN

## 2016-01-22 NOTE — Progress Notes (Addendum)
Subjective: No acute events overnight.  Significant other Scott Mahoney reports that he has seemed comfortable.  Objective:  Vital signs in last 24 hours: Vitals:   01/21/16 1600 01/21/16 1942 01/21/16 2111 01/22/16 0504  BP: 93/68 93/60 (!) 93/53 (!) 94/59  Pulse:   (!) 128 (!) 127  Resp: (!) 21  (!) 24 20  Temp:  97.6 F (36.4 C) 100 F (37.8 C) 100.1 F (37.8 C)  TempSrc:  Oral Axillary Axillary  SpO2:  97% 92% 94%  Weight:   116 lb 2.9 oz (52.7 kg)   Height:       Physical Exam  Constitutional:  Thin, chronically ill appearing, minimally arousable  Eyes: Scleral icterus is present.  Cardiovascular: Regular rhythm and normal heart sounds.   Tachycardic  Pulmonary/Chest: Effort normal. He has no wheezes.  Abdominal:  Belly tense Hepatomegaly  Musculoskeletal: He exhibits no edema or tenderness.  Neurological:  Brief opens eyes to moderate physical stimulation   CBC Latest Ref Rng & Units 01/21/2016 01/20/2016 01/20/2016  WBC 4.0 - 10.5 K/uL 34.9(H) 37.9(H) 40.7(H)  Hemoglobin 13.0 - 17.0 g/dL 7.7(L) 9.0(L) 7.7(L)  Hematocrit 39.0 - 52.0 % 23.4(L) 26.8(L) 23.2(L)  Platelets 150 - 400 K/uL 409(H) 443(H) 467(H)   BMP Latest Ref Rng & Units 01/20/2016 01/19/2016 01/19/2016  Glucose 65 - 99 mg/dL 115(H) 111(H) 111(H)  BUN 6 - 20 mg/dL 17 11 10   Creatinine 0.61 - 1.24 mg/dL 0.74 0.71 0.58(L)  Sodium 135 - 145 mmol/L 130(L) 130(L) 130(L)  Potassium 3.5 - 5.1 mmol/L 4.6 4.5 4.6  Chloride 101 - 111 mmol/L 100(L) 99(L) 101  CO2 22 - 32 mmol/L 17(L) 17(L) 20(L)  Calcium 8.9 - 10.3 mg/dL 8.4(L) 8.2(L) 7.8(L)   Peripheral blood smear 01/10/2016 Reviewed with hematologist Dr Beryle Beams.  Abundant mature neutrophils with some toxic granulations.  No blasts, rare mature lymphs.  Urine culture 01/11/2016 No growth final  Blood cultures 12/31/2015 No growth 4 days  Blood cultures 01/18/2016 No growth 3 days  Ascites culture 01/18/2016 No growth  final  Assessment/Plan:  Principal Problem:   Symptomatic anemia Active Problems:   Liver masses   Lung nodules   Anemia   Acute hypoxemic respiratory failure (HCC)   Palliative care encounter   Dyspnea   RUQ abdominal mass  55 year old man with history of severe alcohol abuse who presents with anemia, leukocytosis, and thrombocythemia and has numerous new lung and liver masses concerning for metastatic disease.  Acute hypoxemic respiratory failure on [id number] of uncertain etiology, now stabilized.  His life expectancy is days.  #Comfort Care Appreciate Palliative care's symptom management. -Morphine continuous infusion -Glycopyrrolate, ativan, senna  #Dyspnea Acutely worsening dyspnea on 11/23, now stabilized and on room air.  Nebulizer treatments initially provided transient relief, but he became progressively more tachypneic and tachycardic over the course of the day.  ABG showed mild hypoxia and respiratory alkalosis.  With crackles and hypoxia, diuresis was attempted with 40mg  IV lasix once without improvement.  He was placed on BiPAP and oxygenated and ventilated well.  However, BiPAP was subsequently removed due to patient preference. -Morphine PRN for dyspnea and pain  #Infection? Intermittently febrile with marked leukocytosis, but most likely does not have bacterial infection.  No diarrhea to suggest C Diff, UA non-infected and urine culture without growth, no consolidation on CXR.  Cholecystitis with gallbladder wall thickening shown on [place].  Blood cultures no growth 2 days.  Ascites fluid cell counts not diagnostic of SBP, ascites culture  no growth 1 day.  His elevated lactic acid is most likely from necrotizing tumors with reactive leukocytosis from metastatic disease. -Continuing empiric levofloxacin if able to swallow  #Symptomatic Anemia Received 2U pRBCs with greater than expected increase in Hgb, held steady after transfusion.  Initial concern for hemolysis with elevated  bili and LDH, but DAT negative, high haptoglobin, Hgb acutely dropped on 11/24 but inconsistent on repeat CBC and no clinical bleeding. -No further labs  #Liver Masses #Lung Masses Likely metastatic disease with possible HCC or pancreatic primary.  He now could tolerate or benefit from a liver biopsy or chemotherapy. -F/u ascites cytology  Dispo: Anticipated discharge to inpatient hospice vs in-hospital death.   Minus Liberty, MD 01/22/2016, 10:43 AM Pager: [phone]

## 2016-01-22 NOTE — Progress Notes (Signed)
Daily Progress Note   Patient Name: Scott Mahoney       Date: 01/22/2016 DOB: 03/28/1960  Age: 55 y.o. MRN#: [id number] Attending Physician: Axel Filler, MD Primary Care Physician: Rogers Blocker, MD Admit Date: 01/05/2016  Reason for Consultation/Follow-up: Non pain symptom management, Pain control, Psychosocial/spiritual support and Terminal Care  Subjective: Patient appears to be transitioning towards end-of-life. He is minimally responsive. He has taken a few bites of Jell-O and sips of water overnight, but mostly not able to take anything by mouth such as pills. He is on a morphine continuous infusion and thus far showing minimal breakthrough doses. Reiterated to nursing role of morphine to aid not only in pain management but as well as dyspnea which has been a problem for patient . Currently there is no respiratory distress, respiratory rate is 24. No observed apnea. Educated fianc and another brother at the bedside, Legrand Como, to nonverbal signs and symptoms of pain, respiratory distress. He is diaphoretic with a low-grade fever  Length of Stay: 5  Current Medications: Scheduled Meds:  . enoxaparin (LOVENOX) injection  40 mg Subcutaneous Q24H  . feeding supplement (ENSURE ENLIVE)  237 mL Oral TID BM  . mouth rinse  15 mL Mouth Rinse BID  . senna  2 tablet Oral QHS  . sodium chloride flush  3 mL Intravenous Q12H    Continuous Infusions: . dextrose 5 % and 0.9% NaCl 1,000 mL infusion 10 mL/hr at 01/21/16 1300  . morphine 1 mg/hr (01/21/16 1800)    PRN Meds: bisacodyl, glycopyrrolate, ipratropium-albuterol, ketorolac, LORazepam, morphine injection, polyethylene glycol  Physical Exam  Constitutional:  Acutely ill man. Appears to be transitioning towards EOL  HENT:    Temporal wasting  Eyes: Scleral icterus is present.  Cardiovascular:  Diaphoretic; tachy 125-130  Pulmonary/Chest:  Resp deep, unlabored. No apnea observed  Abdominal:  ascites  Genitourinary:  Genitourinary Comments: foley  Neurological:  Minimally responsive  Skin:  Diaphoretic, warm. No mottling observed  Nursing note and vitals reviewed.           Vital Signs: BP (!) 94/59 (BP Location: Left Arm)   Pulse (!) 127   Temp 100.1 F (37.8 C) (Axillary)   Resp 20   Ht 5\' 7"  (1.702 m)   Wt 52.7 kg (116 lb 2.9 oz)  SpO2 94%   BMI 18.20 kg/m  SpO2: SpO2: 94 % O2 Device: O2 Device: Not Delivered O2 Flow Rate: O2 Flow Rate (L/min): 0.5 L/min  Intake/output summary:  Intake/Output Summary (Last 24 hours) at 01/22/16 1038 Last data filed at 01/22/16 [id number]  Gross per 24 hour  Intake           224.19 ml  Output              550 ml  Net          -325.81 ml   LBM: Last BM Date: 01/20/16 Baseline Weight: Weight: 54.4 kg (120 lb) Most recent weight: Weight: 52.7 kg (116 lb 2.9 oz)       Palliative Assessment/Data:    Flowsheet Rows   Flowsheet Row Most Recent Value  Intake Tab  Date Notified  01/19/16  Palliative Care Type  New Palliative care  Reason for referral  Pain, Non-pain Symptom, Clarify Goals of Care, Psychosocial or Spiritual support, Counsel Regarding Hospice  Date of Admission  01/22/2016  Date first seen by Palliative Care  01/20/16  # of days Palliative referral response time  1 Day(s)  # of days IP prior to Palliative referral  2  Clinical Assessment  Palliative Performance Scale Score  30%  Pain Max last 24 hours  Not able to report  Pain Min Last 24 hours  Not able to report  Dyspnea Max Last 24 Hours  Not able to report  Dyspnea Min Last 24 hours  Not able to report  Nausea Max Last 24 Hours  Not able to report  Nausea Min Last 24 Hours  Not able to report  Anxiety Max Last 24 Hours  Not able to report  Anxiety Min Last 24 Hours  Not able to  report  Other Max Last 24 Hours  Not able to report  Psychosocial & Spiritual Assessment  Palliative Care Outcomes  Patient/Family meeting held?  Yes  Who was at the meeting?  pt but attempting to reach fiance  Palliative Care Outcomes  Improved non-pain symptom therapy  Patient/Family wishes: Interventions discontinued/not started   Mechanical Ventilation, Trach, PEG  Palliative Care follow-up planned  Yes, Facility      Patient Active Problem List   Diagnosis Date Noted  . Dyspnea   . RUQ abdominal mass   . Palliative care encounter   . Acute hypoxemic respiratory failure ([place])   . Symptomatic anemia 01/16/2016  . Liver masses 01/13/2016  . Lung nodules 01/22/2016  . Anemia 01/13/2016    Palliative Care Assessment & Plan   Patient Profile: 55yo male with hx asthma, COPD, pancreatitis, ETOH abuse presented 11/21 with increasing SOB, cough, 50lbs weight loss. Found to have severe anemia with hgb 4, tempt 101.2, tachycardia. He was admitted by IMTS and abd u/s revealed multiple hepatic masses. 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 Pt has had progressive respiratory failure despite empiric abx, correction of anemia and attempts at diuresis and on 11/23 pt required bipap for increased WOB.    Recommendations/Plan:  Pain: Continue morphine continuous infusion at 1 mg an hour with breakthrough dosing 2-4 mg every hour as needed. Monitor for need for up titration  Dyspnea: Continue with targeted pulmonary treatments such as oxygen via nasal cannula, nebulizer treatments. Primary management with morphine continuous infusion. Monitor for need for up titration  Secretions: I do hear scant upper airway secretions  this morning that he was able to clear with a cough. He is still taking a few bites and sips of liquid, Jell-O. Robinul available on an as-needed basis.  Discussed with nurse role, medication education. Monitor for need for scheduled dosing  Anxiety: Continue with Ativan on an as-needed basis. Monitor for need for scheduled dosing  Goals of Care and Additional Recommendations:  Limitations on Scope of Treatment: Minimize Medications, Initiate Comfort Feeding, No Artificial Feeding, No Blood Transfusions, No Chemotherapy, No Diagnostics, No Glucose Monitoring, No Hemodialysis, No IV Antibiotics, No Lab Draws, No Radiation, No Surgical Procedures and No Tracheostomy  Code Status:    Code Status Orders        Start     Ordered   01/19/16 1805  Do not attempt resuscitation (DNR)  Continuous    Question Answer Comment  In the event of cardiac or respiratory ARREST Do not call a "code blue"   In the event of cardiac or respiratory ARREST Do not perform Intubation, CPR, defibrillation or ACLS   In the event of cardiac or respiratory ARREST Use medication by any route, position, wound care, and other measures to relive pain and suffering. May use oxygen, suction and manual treatment of airway obstruction as needed for comfort.      01/19/16 1807    Code Status History    Date Active Date Inactive Code Status Order ID Comments User Context   01/12/2016  8:37 PM 01/19/2016  6:07 PM Full Code [id number]  Collier Salina, MD Inpatient       Prognosis:   Hours - Days  Discharge Planning:  Anticipated Hospital Death  Care plan was discussed with Dr Inda Castle and Dr Venia Minks  Thank you for allowing the Palliative Medicine Team to assist in the care of this patient.   Time In: 0900 Time Out: 0940 Total Time 40 min Prolonged Time Billed  no       Greater than 50%  of this time was spent counseling and coordinating care related to the above assessment and plan.  Dory Horn, NP  Please contact Palliative Medicine Team phone at [phone] for questions and concerns.

## 2016-01-23 DIAGNOSIS — C787 Secondary malignant neoplasm of liver and intrahepatic bile duct: Secondary | ICD-10-CM

## 2016-01-23 DIAGNOSIS — R0600 Dyspnea, unspecified: Secondary | ICD-10-CM

## 2016-01-23 DIAGNOSIS — C801 Malignant (primary) neoplasm, unspecified: Secondary | ICD-10-CM

## 2016-01-23 DIAGNOSIS — R0682 Tachypnea, not elsewhere classified: Secondary | ICD-10-CM

## 2016-01-23 DIAGNOSIS — C781 Secondary malignant neoplasm of mediastinum: Secondary | ICD-10-CM

## 2016-01-23 DIAGNOSIS — Z515 Encounter for palliative care: Secondary | ICD-10-CM

## 2016-01-23 DIAGNOSIS — C7989 Secondary malignant neoplasm of other specified sites: Secondary | ICD-10-CM

## 2016-01-23 LAB — CULTURE, BLOOD (ROUTINE X 2)
CULTURE: NO GROWTH
Culture: NO GROWTH

## 2016-01-27 NOTE — Progress Notes (Signed)
Morphine gtt wasted, approximately 200cc in the sink. Witnessed by L.Khoi Hamberger RN

## 2016-01-27 NOTE — Discharge Summary (Signed)
  Name: Scott Mahoney MRN: [id number] DOB: 1960-04-05 55 y.o.  Date of Admission: 01/22/2016 10:47 AM Date of Discharge: 02/02/2016 Attending Physician: Axel Filler, MD  Discharge Diagnosis: Principal Problem:   Symptomatic anemia Active Problems:   Liver masses   Lung nodules   Anemia   Acute hypoxemic respiratory failure [facility])   Palliative care encounter   Dyspnea   RUQ abdominal mass   Tachypnea   Terminal care   Cause of death: metastatic cancer Time of death: 06-06-1138 2016/02/02  Disposition and follow-up:   [name] was discharged from [facility] in expired condition.    Hospital Course: [name] was admitted with worsening dyspnea, fevers, night sweats, abdominal pain and distension, and 50-60 lbs weight loss in the past year, and endorsed a long history of heavy alcohol abuse.  On exam, he was cachectic, pale, and had hepatomegaly and abdominal distension suggestive of ascites.  He was found to have leukocytosis above 50 and tobe severely anemic with Hgb of 4.0 and transfused with 2U pRBCs which increased his Hgb to 9.0.  CTA chest ordered with concern for PE revealed multiple lung nodules with interval increase in size from prior 1 month ago, and ascites.  Subsequent abdominal ultrasound and CT abdomen and pelvis revealed hepatic cirrhosis and multiple liver masses, possible pancreatic mass, extensive lymphadenopathy, and an L3 bony lesion consistent with advanced metastatic disease.  Hepatocellular carcinoma or pancreatic cancer were thought to be the most likely primaries.  At admission he was weak, but alert and oriented.  With his fevers and leukocytosis, infection was ruled out with negative blood cultures, urine cultures, diagnostic paracentesis.  He received empiric aztreonam, vancomycin, and levofloxacin in the ED, and was continued on levofloxacin.  Ultimately, he was deemed to be be infected, with his fevers and leukocytosis  attributable to his metastatic disease.  Several days after hospitalization, he was found in acute respiratory distress with paradoxical breathing and accessory muscle usage, tachycardic to 140s and tachypneic to 50s.  ABG showed hypoxic respiratory failure, and he was placed on BiPAP.  He oxygenated well on BiPAP and became less tachypneic.  By his request, BiPAP was discontinued and his respiratory status stabilized.  However, over the next few days he became weaker and his level of consciousness decreased until he became nonresponsive.  He was not stable enough to offer a liver biopsy, and management would not have changed with a pathologic diagnosis to identify his malignancy.  Discussions regarding his almost certain cancer diagnosis and goals of care were initiated early in the hospitalization and facilitated by the Palliative Care team.  After his respiratory distress, he rapidly deteriorated and he was transitioned to comfort care with his agreement and assent of his significant other and siblings, with expected hospital death vs transfer to inpatient hospice.  He was found deceased by his nurse in the late morning.   Signed: Minus Liberty, MD Feb 02, 2016, 12:05 PM

## 2016-01-27 NOTE — Progress Notes (Signed)
Subjective: No acute events overnight.  No family at bedside this morning.  Objective:  Vital signs in last 24 hours: Vitals:   01/21/16 1942 01/21/16 2111 01/22/16 0504 Feb 20, 2016 0621  BP: 93/60 (!) 93/53 (!) 94/59 102/67  Pulse:  (!) 128 (!) 127 (!) 135  Resp:  (!) 24 20 (!) 23  Temp: 97.6 F (36.4 C) 100 F (37.8 C) 100.1 F (37.8 C) 100.1 F (37.8 C)  TempSrc: Oral Axillary Axillary Axillary  SpO2: 97% 92% 94% (!) 85%  Weight:  116 lb 2.9 oz (52.7 kg)    Height:       Physical Exam  Constitutional:  Thin, cachetic man, with labored breathing  Eyes: Scleral icterus is present.  Cardiovascular: Regular rhythm and normal heart sounds.   Tachycardic  Pulmonary/Chest: He has no wheezes.  Breathing with accessory muscle usage Upper airway rattling  Abdominal:  Belly tense Hepatomegaly  Musculoskeletal: He exhibits no edema or tenderness.  Neurological:  Nonresponsive Does not open eyes to voice or touch    CBC Latest Ref Rng & Units 01/21/2016 01/20/2016 01/20/2016  WBC 4.0 - 10.5 K/uL 34.9(H) 37.9(H) 40.7(H)  Hemoglobin 13.0 - 17.0 g/dL 7.7(L) 9.0(L) 7.7(L)  Hematocrit 39.0 - 52.0 % 23.4(L) 26.8(L) 23.2(L)  Platelets 150 - 400 K/uL 409(H) 443(H) 467(H)   BMP Latest Ref Rng & Units 01/20/2016 01/19/2016 01/19/2016  Glucose 65 - 99 mg/dL 115(H) 111(H) 111(H)  BUN 6 - 20 mg/dL 17 11 10   Creatinine 0.61 - 1.24 mg/dL 0.74 0.71 0.58(L)  Sodium 135 - 145 mmol/L 130(L) 130(L) 130(L)  Potassium 3.5 - 5.1 mmol/L 4.6 4.5 4.6  Chloride 101 - 111 mmol/L 100(L) 99(L) 101  CO2 22 - 32 mmol/L 17(L) 17(L) 20(L)  Calcium 8.9 - 10.3 mg/dL 8.4(L) 8.2(L) 7.8(L)   Peripheral blood smear 01/21/2016 Reviewed with hematologist Dr Beryle Beams.  Abundant mature neutrophils with some toxic granulations.  No blasts, rare mature lymphs.  Urine culture 01/08/2016 No growth final  Blood cultures 01/13/2016 No growth 4 days  Blood cultures 01/18/2016 No growth 3 days  Ascites  culture 01/18/2016 No growth final  Assessment/Plan:  Principal Problem:   Symptomatic anemia Active Problems:   Liver masses   Lung nodules   Anemia   Acute hypoxemic respiratory failure (HCC)   Palliative care encounter   Dyspnea   RUQ abdominal mass  55 year old man with history of severe alcohol abuse who presents with anemia, leukocytosis, and thrombocythemia and has numerous new lung and liver masses concerning for metastatic disease.  Acute hypoxemic respiratory failure on [id number] of uncertain etiology, now stabilized.  His life expectancy is limited to days.  #Comfort Care Appreciate Palliative care's symptom management. -Morphine continuous infusion -Glycopyrrolate, ativan, senna  #Dyspnea Acutely worsening dyspnea on 11/23, now stabilized and on room air.  Nebulizer treatments initially provided transient relief, but he became progressively more tachypneic and tachycardic over the course of the day.  ABG showed mild hypoxia and respiratory alkalosis.  With crackles and hypoxia, diuresis was attempted with 40mg  IV lasix once without improvement.  He was placed on BiPAP and oxygenated and ventilated well.  However, BiPAP was subsequently removed due to patient preference. -Morphine PRN for dyspnea and pain  #Infection? Intermittently febrile with marked leukocytosis, but most likely does not have bacterial infection.  No diarrhea to suggest C Diff, UA non-infected and urine culture without growth, no consolidation on CXR.  Cholecystitis with gallbladder wall thickening shown on [place].  Blood cultures no  growth 2 days.  Ascites fluid cell counts not diagnostic of SBP, ascites culture no growth 1 day.  His elevated lactic acid is most likely from necrotizing tumors with reactive leukocytosis from metastatic disease. -Continuing empiric levofloxacin if able to swallow  #Symptomatic Anemia Received 2U pRBCs with greater than expected increase in Hgb, held steady after transfusion.   Initial concern for hemolysis with elevated bili and LDH, but DAT negative, high haptoglobin, Hgb acutely dropped on 11/24 but inconsistent on repeat CBC and no clinical bleeding. -No further labs  #Liver Masses #Lung Masses Likely metastatic disease with possible HCC or pancreatic primary.  He now could tolerate or benefit from a liver biopsy or chemotherapy. -F/u ascites cytology  Dispo: Anticipated discharge to inpatient hospice vs in-hospital death.   Minus Liberty, MD 2016-01-29, 7:10 AM Pager: [phone]

## 2016-01-27 NOTE — Progress Notes (Addendum)
Daily Progress Note   Patient Name: Scott Mahoney       Date: 02-20-16 DOB: 1960/05/07  Age: 55 y.o. MRN#: [id number] Attending Physician: Axel Filler, MD Primary Care Physician: Rogers Blocker, MD Admit Date: 01/15/2016  Reason for Consultation/Follow-up: Non pain symptom management, Pain control and Terminal Care  Subjective: Patient appears to be transitioning towards end-of-life. He will open his eyes to voice, but does not track me and is otherwise unresponsive. No oral intake in 24 hours. He remains on a continuous morphine drip, and has not received any bolus does. On my assessment he had increased WOB, RR 30, and significant upper airway secretions. No observed apnea. He was also diaphoretic and hot to touch.   Length of Stay: 6  Current Medications: Scheduled Meds:  . feeding supplement (ENSURE ENLIVE)  237 mL Oral TID BM  . mouth rinse  15 mL Mouth Rinse BID  . senna  2 tablet Oral QHS  . sodium chloride flush  3 mL Intravenous Q12H    Continuous Infusions: . dextrose 5 % and 0.9% NaCl 1,000 mL infusion 10 mL/hr at 01/21/16 1300  . morphine 1 mg/hr (01/21/16 1800)    PRN Meds: bisacodyl, glycopyrrolate, ipratropium-albuterol, ketorolac, LORazepam, morphine injection, polyethylene glycol  Physical Exam  Constitutional:  Acutely ill man. Appears to be transitioning towards EOL  HENT:  Temporal wasting  Cardiovascular: Tachycardia present.   Diaphoretic; tachy 140  Pulmonary/Chest: Accessory muscle usage present. Tachypnea (RR 30) noted.  Resp shallow and rapid. Significant secretions heard in upper airways.   Abdominal:  ascites  Genitourinary:  Genitourinary Comments: foley  Neurological:  Will open eyes to voice, but is otherwise unresponsive  Skin:  He is diaphoretic.  Hot to touch. Slight mottling noted on distal area of bilateral feet.  Nursing note and vitals reviewed.           Vital Signs: BP 102/67 (BP Location: Left Arm)   Pulse (!) 135   Temp 100.1 F (37.8 C) (Axillary)   Resp (!) 23   Ht 5\' 7"  (1.702 m)   Wt 52.7 kg (116 lb 2.9 oz)   SpO2 (!) 85%   BMI 18.20 kg/m  SpO2: SpO2: (!) 85 % O2 Device: O2 Device: Not Delivered O2 Flow Rate: O2 Flow Rate (L/min): 0.5 L/min  LBM: Last BM Date: 01/20/16 Baseline Weight: Weight: 54.4 kg (120 lb) Most recent weight: Weight: 52.7 kg (116 lb 2.9 oz)       Palliative Assessment/Data:  PPS 10%   Flowsheet Rows   Flowsheet Row Most Recent Value  Intake Tab  Date Notified  01/19/16  Palliative Care Type  New Palliative care  Reason for referral  Pain, Non-pain Symptom, Clarify Goals of Care, Psychosocial or Spiritual support, Counsel Regarding Hospice  Date of Admission  01/02/2016  Date first seen by Palliative Care  01/20/16  # of days Palliative referral response time  1 Day(s)  # of days IP prior to Palliative referral  2  Clinical Assessment  Palliative Performance Scale Score  30%  Pain Max last 24 hours  Not able to report  Pain Min Last 24 hours  Not able to report  Dyspnea Max Last 24 Hours  Not able to report  Dyspnea Min Last 24 hours  Not able to report  Nausea Max Last 24 Hours  Not able to report  Nausea Min Last 24 Hours  Not able to report  Anxiety Max Last 24 Hours  Not able to report  Anxiety Min Last 24 Hours  Not able to report  Other Max Last 24 Hours  Not able to report  Psychosocial & Spiritual Assessment  Palliative Care Outcomes  Patient/Family meeting held?  Yes  Who was at the meeting?  pt but attempting to reach fiance  Palliative Care Outcomes  Improved non-pain symptom therapy  Patient/Family wishes: Interventions discontinued/not started   Mechanical Ventilation, Trach, PEG  Palliative Care follow-up planned  Yes, Facility       Patient Active Problem List   Diagnosis Date Noted  . Dyspnea   . RUQ abdominal mass   . Palliative care encounter   . Acute hypoxemic respiratory failure ([place])   . Symptomatic anemia 01/02/2016  . Liver masses 12/28/2015  . Lung nodules 01/08/2016  . Anemia 01/07/2016    Palliative Care Assessment & Plan   Patient Profile: 55yo male with hx asthma, COPD, pancreatitis, ETOH abuse presented 11/21 with increasing SOB, cough, 50lbs weight loss. Found to have severe anemia with hgb 4, tempt 101.2, tachycardia. He was admitted by IMTS and abd u/s revealed multiple hepatic masses. 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 Pt has had progressive respiratory failure despite empiric abx, correction of anemia and attempts at diuresis and on 11/23 pt required bipap for increased WOB. After discussion with family, he was transitioned to full comfort care. He is approaching the end of his life, which I expect will be hours to days.  Recommendations/Plan:  Pain: Will increase morphine continuous infusion to 2mg /hr with breakthrough dosing 2-4 mg every hour as needed. Monitor for need for up titration. I also discussed with care nurse the necessity of using PRN doses to manage symptoms at that moment, as continuous infusion titration will take a few hours to reach steady state.  Dyspnea: Continue with targeted pulmonary treatments such as oxygen via nasal cannula, and nebulizer treatments. Primary management with morphine continuous infusion.   Secretions: I do hear copious upper airway secretions this morning. Pt has Robinul ordered but none has been given; discussed with care nurse utilization for secretion management. May consider scheduled dosing.  Anxiety: Continue with Ativan on an as-needed basis. Monitor for need for scheduled dosing; pt demonstrates no behavioral signs of  agitation at present.   Fever: Ketoralac available, however no PRN doses utilized.  *In light of his presentation on my assessment, care nurse asked to increase morphine infusion rate to 2mg /hr, give 2mg  morphine breakthrough dose, Robinul 0.4mg , and Toradol 30mg  half an hour after breakthrough morphine dose if he remains hot to touch.  Goals of Care and Additional Recommendations:  Limitations on Scope of Treatment: Minimize Medications, Initiate Comfort Feeding, No Artificial Feeding, No Blood Transfusions, No Chemotherapy, No Diagnostics, No Glucose Monitoring, No Hemodialysis, No IV Antibiotics, No Lab Draws, No Radiation, No Surgical Procedures and No Tracheostomy  Code Status:    Code Status Orders        Start     Ordered   01/19/16 1805  Do not attempt resuscitation (DNR)  Continuous    Question Answer Comment  In the event of cardiac or respiratory ARREST Do not call a "code blue"   In the event of cardiac or respiratory ARREST Do not perform Intubation, CPR, defibrillation or ACLS   In the event of cardiac or respiratory ARREST Use medication by any route, position, wound care, and other measures to relive pain and suffering. May use oxygen, suction and manual treatment of airway obstruction as needed for comfort.      01/19/16 1807    Code Status History    Date Active Date Inactive Code Status Order ID Comments User Context   01/20/2016  8:37 PM 01/19/2016  6:07 PM Full Code [id number]  Collier Salina, MD Inpatient       Prognosis:   Hours - Days  Discharge Planning:  Anticipated Hospital Death  Care plan was discussed with Lauren (care RN)  Thank you for allowing the Palliative Medicine Team to assist in the care of this patient.   Time In: 0840 Time Out: 0915 Total Time 35 min Prolonged Time Billed  no       Greater than 50%  of this time was spent counseling and coordinating care related to the above assessment and plan.  Jannette Fogo,  NP  Please contact Palliative Medicine Team phone at [phone] for questions and concerns.

## 2016-01-27 NOTE — Progress Notes (Signed)
Nutrition Brief Note  Chart reviewed. Pt now transitioning to comfort care.  No further nutrition interventions warranted at this time.  Please re-consult as needed.   Jaryd Drew A. Laportia Carley, RD, LDN, CDE Pager: [phone] After hours Pager: [phone]  

## 2016-01-27 DEATH — deceased

## 2017-04-06 IMAGING — CT CT ABD-PELV W/ CM
2 of 5 series · 13 of 46 positions shown, 15 images · IV contrast (APPLIED)
Comparison: none

CLINICAL DATA: 60 pound weight loss and abdominal tenderness

EXAM:
CT ABDOMEN AND PELVIS WITH CONTRAST
TECHNIQUE: Multidetector CT imaging of the abdomen and pelvis was performed
using the standard protocol following bolus administration of
intravenous contrast.
CONTRAST:  75mL [id number] IOPAMIDOL ([id number]) INJECTION 61%
CT angiogram chest January 17, 2016

[Series 2: abd/ pelvis 5.0 i30f 1 · axial · 0.71mm/px · z∈[+304,+694]mm · 10 of 88 slices shown, 12 images]
[im 5/88  soft-tissue]
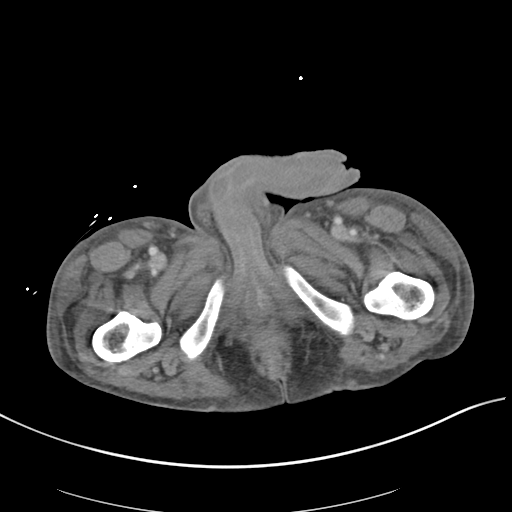
[im 5/88  bone]
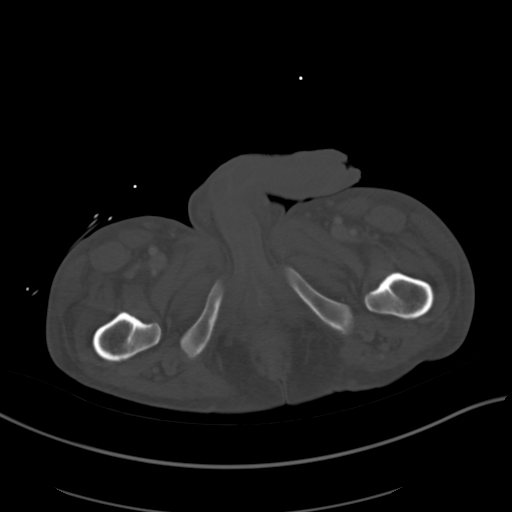
[im 14/88  soft-tissue]
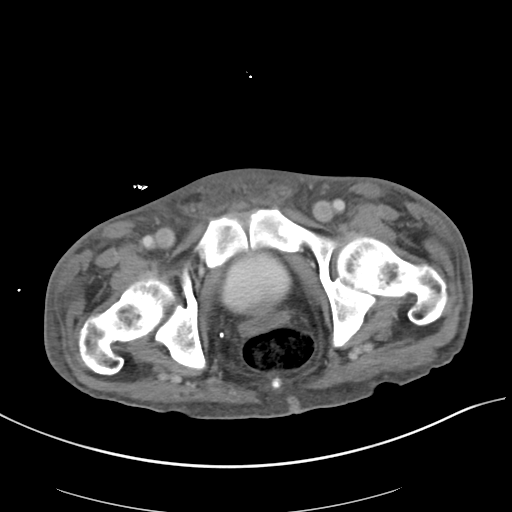
[im 23/88  soft-tissue]
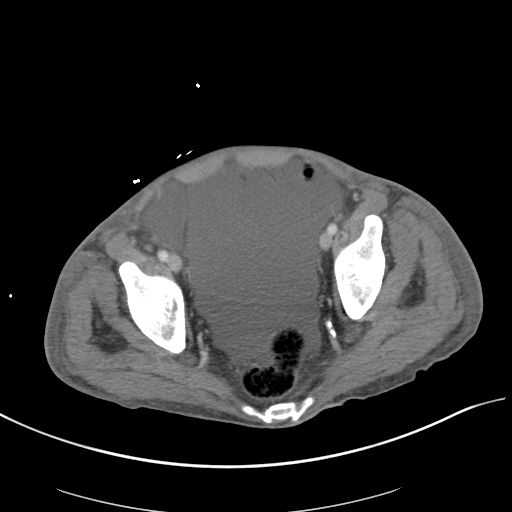
[im 33/88  soft-tissue]
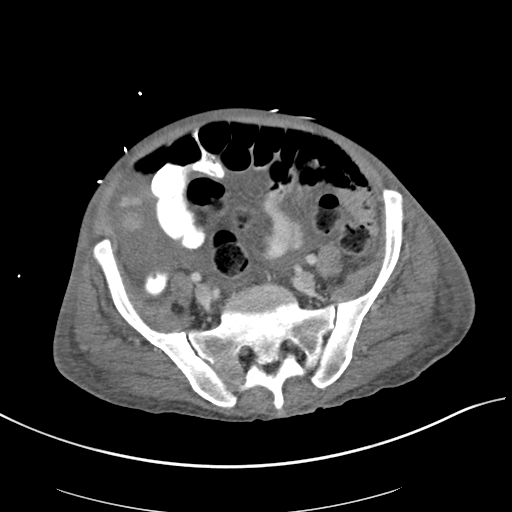
[im 42/88  soft-tissue]
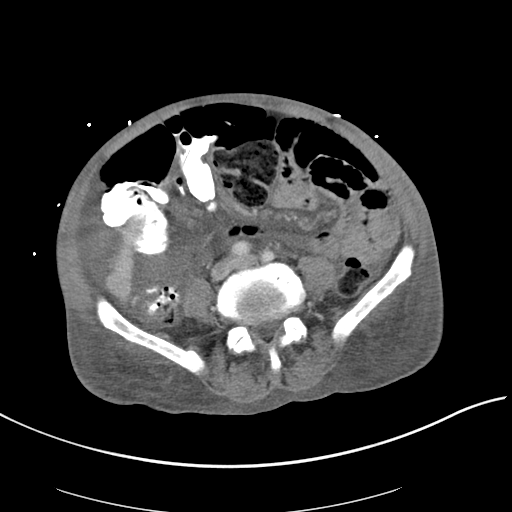
[im 46/88  soft-tissue]
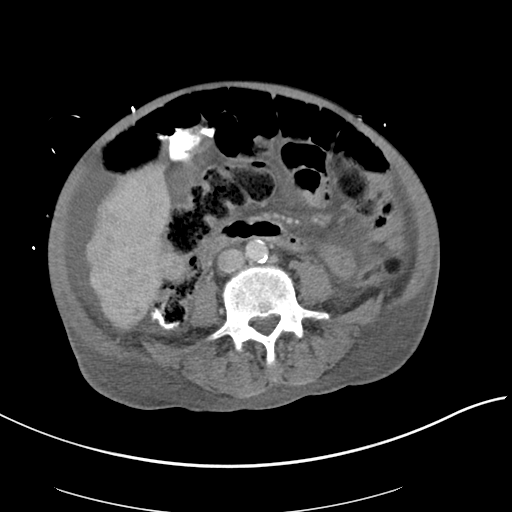
[im 55/88  soft-tissue]
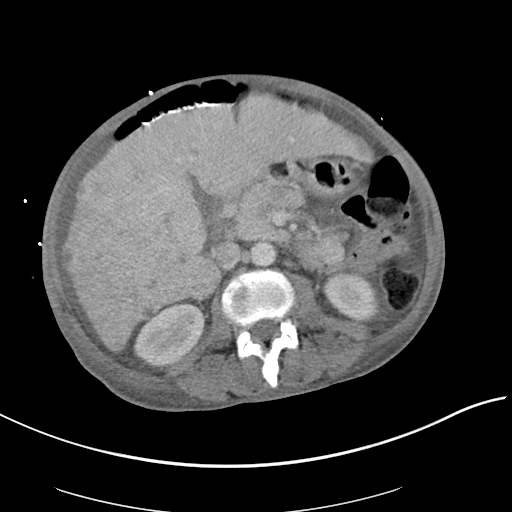
[im 65/88  soft-tissue]
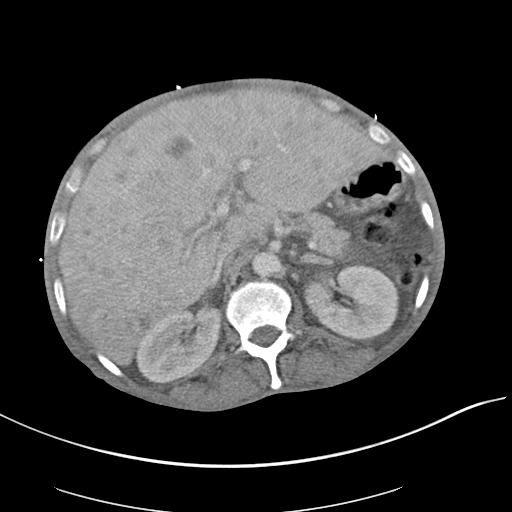
[im 74/88  soft-tissue]
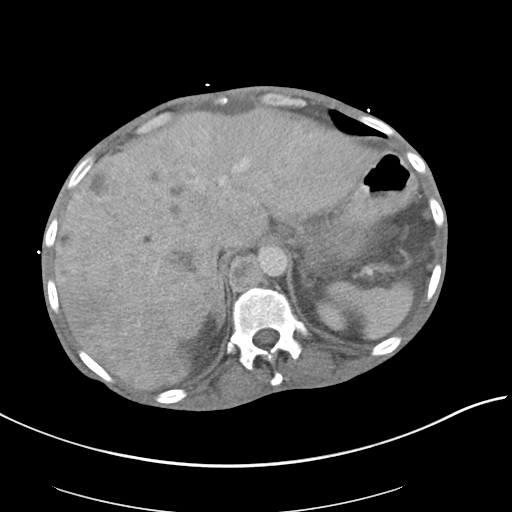
[im 74/88  bone]
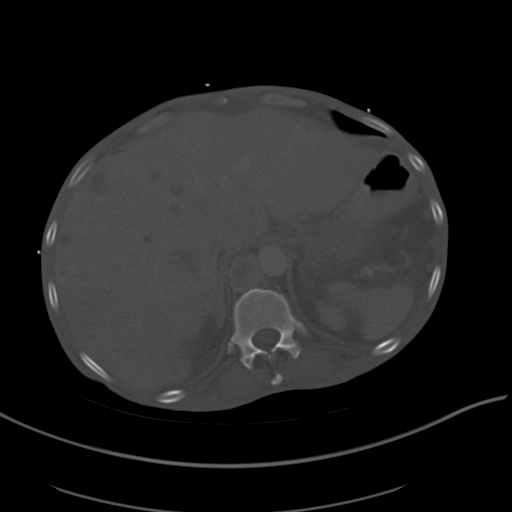
[im 83/88  soft-tissue]
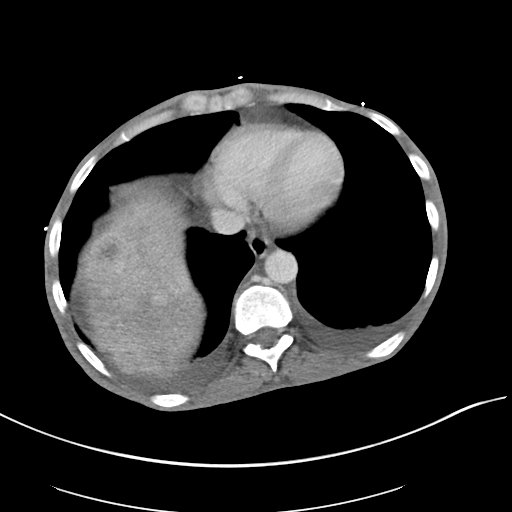

[Series 5: coronal soft tissue · coronal · 0.75mm/px · 3 of 101 slices shown]
[im 34/101  soft-tissue]
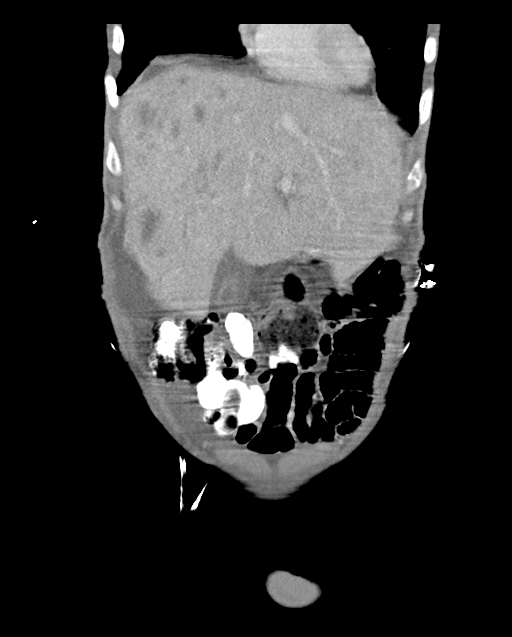
[im 45/101  soft-tissue]
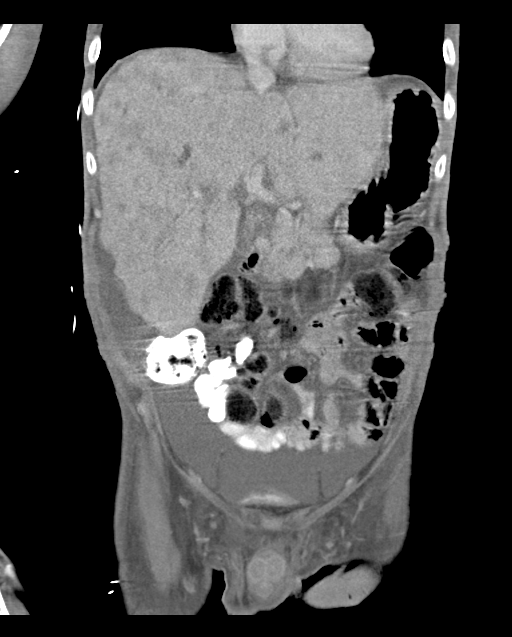
[im 56/101  soft-tissue]
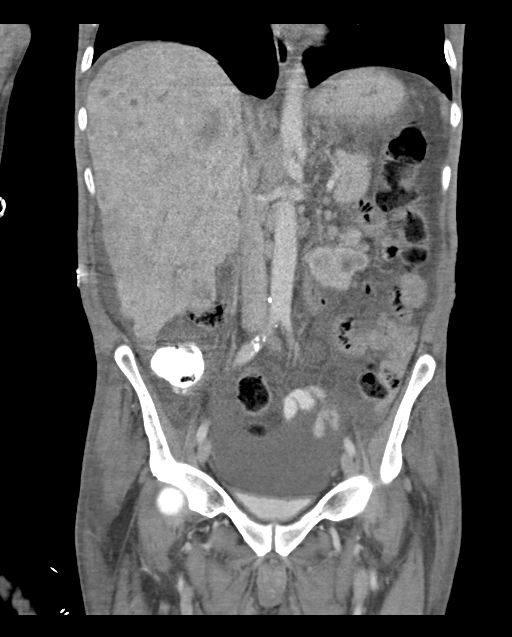

[13 of 46 positions shown; findings below may reference images not displayed]

FINDINGS: Lower chest: There are moderate pleural effusions bilaterally. There
is a 3 mm nodular opacity in the superior segment of the right lower
lobe on axial slice 1 series 3. There is a 3 mm nodular opacity in
the inferior lingula on axial slice 2 series 3. There is atelectatic
change in the left lung base.

Hepatobiliary: The liver measures 21.5 cm in length. The liver has a
lobular contour, likely due to underlying hepatic cirrhosis. There
are mass lesions throughout the liver involving all lobes in
segments. Lesions range in size from as small as 5 mm to a confluent
area in the right lobe measuring 9.3 x 6.3 cm. This appearance is
consistent with widespread hepatic metastatic disease. The
gallbladder appears somewhat contracted. The gallbladder is
surrounded by ascites. There is no appreciable biliary duct
dilatation.

Pancreas: There is a mass either arising from or abutting the body
of the pancreas measuring 3.3 x 2.9 cm. There is no pancreatic duct
dilatation. No pancreatic inflammation seen. There are multiple
enlarged lymph nodes in the peripancreatic region

Spleen: No splenic lesions are evident.

Adrenals/Urinary Tract: Adrenals appear normal bilaterally. Kidneys
bilaterally show no evidence of mass or hydronephrosis. There is no
renal or ureteral calculus on either side. Urinary bladder is
midline with wall thickness within normal limits.

Stomach/Bowel: There is no appreciable bowel wall or mesenteric
thickening. There is no appreciable bowel obstruction. No free air
or portal venous air. There is moderate stool throughout the colon
which potentially could mask a colonic lesion.

Vascular/Lymphatic: There is atherosclerotic calcification in the
aorta and proximal common iliac arteries. No abdominal aortic
aneurysm. Major mesenteric vessels appear normal.

There is extensive adenopathy in the periportal and peripancreatic
regions. There is adenopathy with what appears to be central
necrosis inferior to the tail of the pancreas in the left mid
abdomen measuring 4.3 x 2.4 cm. The largest individual lymph node in
the periportal region measures 2.0 x 1.6 cm. There are multiple
retroperitoneal lymph nodes which are borderline prominent in size.
A lymph node between the aorta and inferior vena cava measures 1.7 x
1.4 cm. There is a right retrocrural lymph noted adjacent to the
aorta at the diaphragmatic hiatus measuring 2.7 x 2.6 cm with a
necrotic central region. There is no appreciable pelvic adenopathy.

Reproductive: Prostate and seminal vesicles appear normal in size
and contour. There is mild calcification in the prostate consistent
with small calculi.

Other: There is extensive ascites throughout the abdomen and pelvis.
There is no abscess in the abdomen or pelvis. There is no
periappendiceal region inflammation. Appendix not seen.

Musculoskeletal: There is a lesion in the anterior aspect of the L3
vertebral body superiorly, suspicious for a metastatic focus. No
other bony metastases identified. There is no intramuscular lesion.
There is soft tissue edema, likely anasarca.
IMPRESSION: Extensive neoplastic changes noted. The liver is enlarged with a
lobular contour consistent with cirrhosis. There are mass lesions
throughout the entire liver involving all lobes in segments. There
is a confluent mass in the right lobe with smaller lesions elsewhere
throughout the liver. There is extensive peripancreatic, periportal,
retrocrural, and to a lesser extent retroperitoneal adenopathy.
There is a mass either within or abutting the body of the pancreas
measuring 3.3 x 2.9 cm. This lesion has apparent central necrosis as
does adenopathy in the retrocrural and left mesenteric regions. This
lesion probably represents adenopathy abutting the pancreas as
opposed to arising from the pancreas given lack of pancreatic duct
dilatation. There is extensive ascites.

There are free-flowing pleural effusions with small nodular
opacities in the lungs bilaterally, likely metastatic foci. There is
an apparent metastasis in the anterior superior aspect of the L3
vertebral body.

Given apparent cirrhosis, the findings could well be due to hepatic
cellular carcinoma with widespread metastases. If the lesion at the
level of the body of the pancreas actually arises from the pancreas,
a pancreatic primary in the face of cirrhosis with metastatic
disease is a differential consideration. It should be noted that
many of the liver lesions would be readily amenable to tissue
sampling percutaneously.

No bowel obstruction or bowel wall thickening is evident. No
abscess. There is aortoiliac atherosclerosis.

There is evidence of anasarca.

## 2017-04-07 IMAGING — DX DG CHEST 1V PORT
1 series · 1 of 1 positions shown · non-contrast
Comparison: 01/18/2016 and earlier, including CT chest 01/17/2016
and 12/16/2015.

CLINICAL DATA: 54-year-old with current history of COPD, presenting
with acute onset of tachypnea

EXAM:
PORTABLE CHEST 1 VIEW

[chest ap]
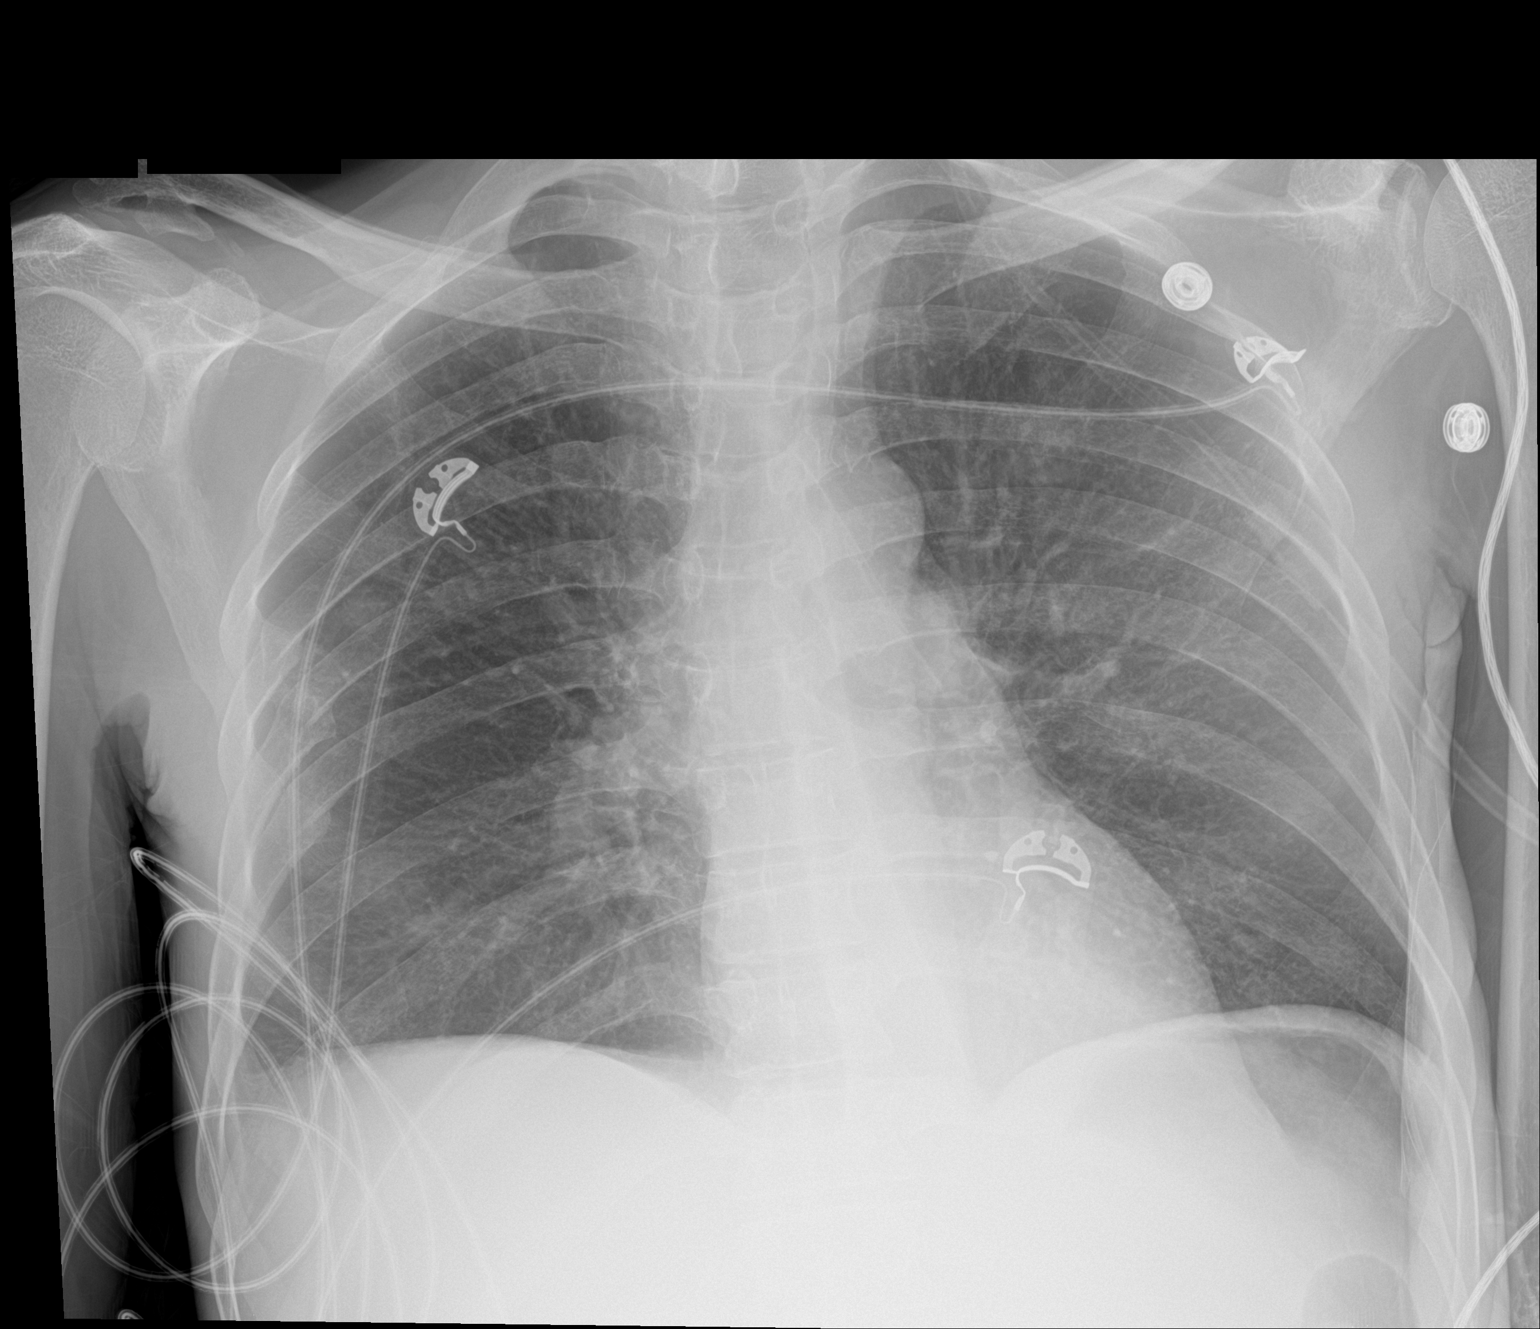

[1 of 1 positions shown; findings below may reference images not displayed]

FINDINGS: Cardiomediastinal silhouette unremarkable, unchanged. Lungs clear.
Bronchovascular markings normal. Pulmonary vascularity normal. No
visible pleural effusions. No pneumothorax. Old healed right rib
fractures again noted.
IMPRESSION: No acute cardiopulmonary disease.
